# Patient Record
Sex: Male | Born: 1935 | Race: White | Hispanic: No | Smoking: Never smoker
Health system: Southern US, Community
[De-identification: ages and names within clinical notes are randomized; demographics above are authoritative.]

## PROBLEM LIST (undated history)

## (undated) DIAGNOSIS — F419 Anxiety disorder, unspecified: Secondary | ICD-10-CM

## (undated) DIAGNOSIS — F32A Depression, unspecified: Secondary | ICD-10-CM

## (undated) DIAGNOSIS — F329 Major depressive disorder, single episode, unspecified: Secondary | ICD-10-CM

## (undated) HISTORY — PX: SKIN BIOPSY: SHX1

---

## 2015-06-25 ENCOUNTER — Encounter: Payer: Self-pay | Admitting: Emergency Medicine

## 2015-06-25 ENCOUNTER — Emergency Department: Payer: Medicare Other

## 2015-06-25 ENCOUNTER — Inpatient Hospital Stay
Admission: EM | Admit: 2015-06-25 | Discharge: 2015-06-29 | DRG: 871 | Disposition: A | Discharge status: Home / Self Care | Payer: Medicare Other | Attending: Internal Medicine | Admitting: Internal Medicine

## 2015-06-25 DIAGNOSIS — G3183 Dementia with Lewy bodies: Secondary | ICD-10-CM | POA: Diagnosis not present

## 2015-06-25 DIAGNOSIS — J690 Pneumonitis due to inhalation of food and vomit: Secondary | ICD-10-CM | POA: Diagnosis not present

## 2015-06-25 DIAGNOSIS — Z79899 Other long term (current) drug therapy: Secondary | ICD-10-CM | POA: Diagnosis not present

## 2015-06-25 DIAGNOSIS — R451 Restlessness and agitation: Secondary | ICD-10-CM | POA: Diagnosis present

## 2015-06-25 DIAGNOSIS — F0390 Unspecified dementia without behavioral disturbance: Secondary | ICD-10-CM | POA: Diagnosis not present

## 2015-06-25 DIAGNOSIS — R05 Cough: Secondary | ICD-10-CM

## 2015-06-25 DIAGNOSIS — E43 Unspecified severe protein-calorie malnutrition: Secondary | ICD-10-CM | POA: Diagnosis not present

## 2015-06-25 DIAGNOSIS — J9601 Acute respiratory failure with hypoxia: Secondary | ICD-10-CM | POA: Diagnosis not present

## 2015-06-25 DIAGNOSIS — F0391 Unspecified dementia with behavioral disturbance: Secondary | ICD-10-CM | POA: Diagnosis present

## 2015-06-25 DIAGNOSIS — G9341 Metabolic encephalopathy: Secondary | ICD-10-CM | POA: Diagnosis present

## 2015-06-25 DIAGNOSIS — N390 Urinary tract infection, site not specified: Secondary | ICD-10-CM | POA: Diagnosis present

## 2015-06-25 DIAGNOSIS — R0602 Shortness of breath: Secondary | ICD-10-CM

## 2015-06-25 DIAGNOSIS — J189 Pneumonia, unspecified organism: Secondary | ICD-10-CM

## 2015-06-25 DIAGNOSIS — R059 Cough, unspecified: Secondary | ICD-10-CM

## 2015-06-25 DIAGNOSIS — Z681 Body mass index (BMI) 19 or less, adult: Secondary | ICD-10-CM | POA: Diagnosis not present

## 2015-06-25 DIAGNOSIS — R6521 Severe sepsis with septic shock: Secondary | ICD-10-CM | POA: Diagnosis present

## 2015-06-25 DIAGNOSIS — Z66 Do not resuscitate: Secondary | ICD-10-CM | POA: Diagnosis present

## 2015-06-25 DIAGNOSIS — A419 Sepsis, unspecified organism: Secondary | ICD-10-CM | POA: Diagnosis not present

## 2015-06-25 DIAGNOSIS — R64 Cachexia: Secondary | ICD-10-CM | POA: Diagnosis present

## 2015-06-25 HISTORY — DX: Depression, unspecified: F32.A

## 2015-06-25 HISTORY — DX: Major depressive disorder, single episode, unspecified: F32.9

## 2015-06-25 HISTORY — DX: Anxiety disorder, unspecified: F41.9

## 2015-06-25 LAB — COMPREHENSIVE METABOLIC PANEL
ALK PHOS: 78 U/L (ref 38–126)
ALT: 11 U/L — AB (ref 17–63)
AST: 19 U/L (ref 15–41)
Albumin: 3.7 g/dL (ref 3.5–5.0)
Anion gap: 9 (ref 5–15)
BUN: 29 mg/dL — AB (ref 6–20)
CALCIUM: 9 mg/dL (ref 8.9–10.3)
CHLORIDE: 105 mmol/L (ref 101–111)
CO2: 27 mmol/L (ref 22–32)
CREATININE: 1.1 mg/dL (ref 0.61–1.24)
GFR, EST NON AFRICAN AMERICAN: 57 mL/min — AB (ref >60)
Glucose, Bld: 125 mg/dL — ABNORMAL HIGH (ref 65–99)
Potassium: 4.3 mmol/L (ref 3.5–5.1)
SODIUM: 141 mmol/L (ref 135–145)
Total Bilirubin: 0.9 mg/dL (ref 0.3–1.2)
Total Protein: 7.5 g/dL (ref 6.5–8.1)

## 2015-06-25 LAB — URINALYSIS COMPLETE WITH MICROSCOPIC (ARMC ONLY)
Bilirubin Urine: NEGATIVE
GLUCOSE, UA: NEGATIVE mg/dL
HGB URINE DIPSTICK: NEGATIVE
Ketones, ur: NEGATIVE mg/dL
Nitrite: NEGATIVE
PROTEIN: NEGATIVE mg/dL
SPECIFIC GRAVITY, URINE: 1.017 (ref 1.005–1.030)
pH: 7 (ref 5.0–8.0)

## 2015-06-25 LAB — CBC WITH DIFFERENTIAL/PLATELET
BASOS PCT: 0 %
Basophils Absolute: 0.1 10*3/uL (ref 0–0.1)
EOS ABS: 0 10*3/uL (ref 0–0.7)
EOS PCT: 0 %
HCT: 33.3 % — ABNORMAL LOW (ref 40.0–52.0)
Hemoglobin: 10.8 g/dL — ABNORMAL LOW (ref 13.0–18.0)
LYMPHS ABS: 0.3 10*3/uL — AB (ref 1.0–3.6)
Lymphocytes Relative: 2 %
MCH: 30.2 pg (ref 26.0–34.0)
MCHC: 32.5 g/dL (ref 32.0–36.0)
MCV: 92.8 fL (ref 80.0–100.0)
MONOS PCT: 4 %
Monocytes Absolute: 0.7 10*3/uL (ref 0.2–1.0)
NEUTROS PCT: 94 %
Neutro Abs: 14.2 10*3/uL — ABNORMAL HIGH (ref 1.4–6.5)
PLATELETS: 283 10*3/uL (ref 150–440)
RBC: 3.59 MIL/uL — ABNORMAL LOW (ref 4.40–5.90)
RDW: 14.1 % (ref 11.5–14.5)
WBC: 15.3 10*3/uL — ABNORMAL HIGH (ref 3.8–10.6)

## 2015-06-25 LAB — CREATININE, SERUM
CREATININE: 1.06 mg/dL (ref 0.61–1.24)
GFR calc Af Amer: >60 mL/min (ref >60)
GFR calc non Af Amer: 60 mL/min — ABNORMAL LOW (ref >60)

## 2015-06-25 LAB — CBC
HEMATOCRIT: 30.4 % — AB (ref 40.0–52.0)
HEMOGLOBIN: 10.1 g/dL — AB (ref 13.0–18.0)
MCH: 30.8 pg (ref 26.0–34.0)
MCHC: 33.1 g/dL (ref 32.0–36.0)
MCV: 93.2 fL (ref 80.0–100.0)
Platelets: 239 10*3/uL (ref 150–440)
RBC: 3.27 MIL/uL — AB (ref 4.40–5.90)
RDW: 13.7 % (ref 11.5–14.5)
WBC: 13.6 10*3/uL — ABNORMAL HIGH (ref 3.8–10.6)

## 2015-06-25 LAB — LACTIC ACID, PLASMA
LACTIC ACID, VENOUS: 1.2 mmol/L (ref 0.5–2.0)
LACTIC ACID, VENOUS: 0.9 mmol/L (ref 0.5–2.0)

## 2015-06-25 MED ORDER — ACETAMINOPHEN 120 MG RE SUPP
950.0000 mg | Freq: Once | RECTAL | Status: AC
  Administered 2015-06-25: 950 mg via RECTAL
  Filled 2015-06-25: qty 1

## 2015-06-25 MED ORDER — ENOXAPARIN SODIUM 40 MG/0.4ML ~~LOC~~ SOLN
40.0000 mg | SUBCUTANEOUS | Status: DC
  Administered 2015-06-25 – 2015-06-28 (×4): 40 mg via SUBCUTANEOUS
  Filled 2015-06-25 (×4): qty 0.4

## 2015-06-25 MED ORDER — POLYETHYLENE GLYCOL 3350 17 G PO PACK
17.0000 g | PACK | Freq: Every day | ORAL | Status: DC
  Administered 2015-06-27: 17 g via ORAL
  Filled 2015-06-25 (×2): qty 1

## 2015-06-25 MED ORDER — ONDANSETRON HCL 4 MG PO TABS: 4.0000 mg | ORAL_TABLET | Freq: Four times a day (QID) | ORAL | Status: DC | PRN

## 2015-06-25 MED ORDER — MIRTAZAPINE 15 MG PO TABS
15.0000 mg | ORAL_TABLET | Freq: Every day | ORAL | Status: DC
  Administered 2015-06-25 – 2015-06-26 (×2): 15 mg via ORAL
  Filled 2015-06-25 (×2): qty 1

## 2015-06-25 MED ORDER — ALUM & MAG HYDROXIDE-SIMETH 200-200-20 MG/5ML PO SUSP: 30.0000 mL | Freq: Four times a day (QID) | ORAL | Status: DC | PRN

## 2015-06-25 MED ORDER — LORAZEPAM BOLUS VIA INFUSION
0.5000 mg | Freq: Three times a day (TID) | INTRAVENOUS | Status: DC | PRN
Start: 2015-06-25 — End: 2015-06-25

## 2015-06-25 MED ORDER — ACETAMINOPHEN 650 MG RE SUPP
650.0000 mg | Freq: Four times a day (QID) | RECTAL | Status: DC | PRN
Start: 2015-06-25 — End: 2015-06-29
  Administered 2015-06-26: 650 mg via RECTAL
  Filled 2015-06-25: qty 1

## 2015-06-25 MED ORDER — LORAZEPAM 2 MG/ML IJ SOLN
0.5000 mg | Freq: Three times a day (TID) | INTRAMUSCULAR | Status: DC | PRN
Start: 2015-06-25 — End: 2015-06-26

## 2015-06-25 MED ORDER — SODIUM CHLORIDE 0.9 % IV BOLUS (SEPSIS)
1813.0000 mL | Freq: Once | INTRAVENOUS | Status: AC
  Administered 2015-06-25: 1813 mL via INTRAVENOUS

## 2015-06-25 MED ORDER — DEXTROSE 5 % IV SOLN
500.0000 mg | INTRAVENOUS | Status: DC
  Administered 2015-06-25: 500 mg via INTRAVENOUS
  Filled 2015-06-25 (×2): qty 500

## 2015-06-25 MED ORDER — HYDROCODONE-ACETAMINOPHEN 5-325 MG PO TABS: 1.0000 | ORAL_TABLET | ORAL | Status: DC | PRN

## 2015-06-25 MED ORDER — ONDANSETRON HCL 4 MG/2ML IJ SOLN: 4.0000 mg | Freq: Four times a day (QID) | INTRAMUSCULAR | Status: DC | PRN

## 2015-06-25 MED ORDER — SENNOSIDES-DOCUSATE SODIUM 8.6-50 MG PO TABS: 1.0000 | ORAL_TABLET | Freq: Every evening | ORAL | Status: DC | PRN

## 2015-06-25 MED ORDER — ACETAMINOPHEN 325 MG PO TABS
650.0000 mg | ORAL_TABLET | Freq: Four times a day (QID) | ORAL | Status: DC | PRN
Start: 2015-06-25 — End: 2015-06-29

## 2015-06-25 MED ORDER — SODIUM CHLORIDE 0.9 % IJ SOLN
3.0000 mL | Freq: Two times a day (BID) | INTRAMUSCULAR | Status: DC
  Administered 2015-06-26 – 2015-06-28 (×5): 3 mL via INTRAVENOUS

## 2015-06-25 MED ORDER — SODIUM CHLORIDE 0.9 % IV SOLN
INTRAVENOUS | Status: DC
  Administered 2015-06-25 – 2015-06-28 (×4): via INTRAVENOUS

## 2015-06-25 MED ORDER — MELATONIN 3 MG PO TABS: 3.0000 mg | ORAL_TABLET | Freq: Every day | ORAL | Status: DC

## 2015-06-25 MED ORDER — LORAZEPAM 2 MG/ML IJ SOLN
0.5000 mg | Freq: Once | INTRAMUSCULAR | Status: AC
Start: 2015-06-25 — End: 2015-06-25
  Administered 2015-06-25: 0.5 mg via INTRAVENOUS
  Filled 2015-06-25: qty 1

## 2015-06-25 MED ORDER — VANCOMYCIN HCL IN DEXTROSE 1-5 GM/200ML-% IV SOLN
1000.0000 mg | Freq: Once | INTRAVENOUS | Status: AC
  Administered 2015-06-25: 1000 mg via INTRAVENOUS
  Filled 2015-06-25: qty 200

## 2015-06-25 MED ORDER — DEXTROSE 5 % IV SOLN
1.0000 g | INTRAVENOUS | Status: DC
  Administered 2015-06-25: 21:00:00 1 g via INTRAVENOUS
  Filled 2015-06-25 (×2): qty 10

## 2015-06-25 MED ORDER — RISPERIDONE 0.5 MG PO TABS
0.5000 mg | ORAL_TABLET | Freq: Every day | ORAL | Status: DC
  Administered 2015-06-25 – 2015-06-26 (×2): 0.5 mg via ORAL
  Filled 2015-06-25 (×3): qty 1

## 2015-06-25 MED ORDER — PIPERACILLIN-TAZOBACTAM 3.375 G IVPB
3.3750 g | Freq: Once | INTRAVENOUS | Status: AC
  Administered 2015-06-25: 3.375 g via INTRAVENOUS
  Filled 2015-06-25: qty 50

## 2015-06-25 MED ORDER — VITAMIN D (ERGOCALCIFEROL) 1.25 MG (50000 UNIT) PO CAPS: 50000.0000 [IU] | ORAL_CAPSULE | ORAL | Status: DC

## 2015-06-25 NOTE — ED Notes (Signed)
Per EMS:  likes to be called Lu   Family called because he has been acting out of norm, ams, weakness, low grade fever, 99.4 axillary.  family denies foul odors, denies dysuria. Only medial hx is anxiety/depression.     EMS Vital Signs: CBG 128 BP: 140/74 HR: 104 RR: 20 Spo2: 92% RA, 3L 95% ETCO2: 18

## 2015-06-25 NOTE — ED Provider Notes (Signed)
[facility] Emergency Department Provider Note   ____________________________________________  Time seen:  I have reviewed the triage vital signs and the triage nursing note.  HISTORY  Chief Complaint Altered Mental Status and Weakness   Historian Patient is a limited historian with dementia, most history from patient's son  HPI Zakariye Nee is a 81 y.o. male who is currently living with his son, and has a history of dementia, but otherwise good health, who is presenting for evaluation due to the extreme weakness and a new cough for one day. Multiple family members have an upper respiratory infection. They did not know he had a fever until he arrived to triage. No vomiting, abdominal pain, or diarrhea. No skin rashes.    Past Medical History  Diagnosis Date  . Anxiety   . Depression    dementia  Patient Active Problem List   Diagnosis Date Noted  . Sepsis (HCC) 06/25/2015    History reviewed. No pertinent past surgical history.  Current Outpatient Rx  Name  Route  Sig  Dispense  Refill  . Melatonin 3 MG TABS   Oral   Take 3 mg by mouth at bedtime.         . mirtazapine (REMERON) 15 MG tablet   Oral   Take 15 mg by mouth at bedtime.         . polyethylene glycol (MIRALAX / GLYCOLAX) packet   Oral   Take 17 g by mouth daily.         . risperiDONE (RISPERDAL) 1 MG tablet   Oral   Take 0.5 mg by mouth at bedtime.         . Vitamin D, Ergocalciferol, (DRISDOL) 50000 UNITS CAPS capsule   Oral   Take 50,000 Units by mouth once a week. On Tuesday night           Allergies Review of patient's allergies indicates no known allergies.  No family history on file.  Social History Social History  Substance Use Topics  . Smoking status: Never Smoker   . Smokeless tobacco: None  . Alcohol Use: No    Review of Systems  Constitutional: Positive for fever. Eyes: Negative for visual changes. ENT: Negative for sore  throat. Cardiovascular: Negative for chest pain. Respiratory: Negative for shortness of breath. Positive for cough, nonproductive. Gastrointestinal: Negative for abdominal pain, vomiting and diarrhea. Genitourinary: Negative for dysuria. Musculoskeletal: Negative for back pain. Skin: Negative for rash. Neurological: Negative for headache. 10 point Review of Systems otherwise negative ____________________________________________   PHYSICAL EXAM:  VITAL SIGNS: ED Triage Vitals  Enc Vitals Group     BP 06/25/15 1350 149/67 mmHg     Pulse Rate 06/25/15 1350 108     Resp 06/25/15 1350 19     Temp 06/25/15 1414 103.3 F (39.6 C)     Temp Source 06/25/15 1414 Rectal     SpO2 06/25/15 1350 94 %     Weight 06/25/15 1416 133 lb (60.328 kg)     Height 06/25/15 1416  (1.905 m)     Head Cir --      Peak Flow --      Pain Score --      Pain Loc --      Pain Edu? --      Excl. in GC? --      Constitutional: Alert and cooperative, confused. In no distress. Eyes: Conjunctivae are normal. PERRL. Normal extraocular movements. ENT   Head: Normocephalic and  atraumatic.   Nose: No congestion/rhinnorhea.   Mouth/Throat: Mucous membranes are mildly dry.   Neck: No stridor. No pharyngeal erythema. Cardiovascular/Chest: Tachycardic, regular.  No murmurs, rubs, or gallops. Respiratory: Mild rhonchi posteriorly bilaterally. No wheezing. No retractions Gastrointestinal: Soft. No distention, no guarding, no rebound. Nontender   Genitourinary/rectal: No sacral wound Musculoskeletal: Nontender with normal range of motion in all extremities. No joint effusions.  No lower extremity tenderness.  No edema. Neurologic:  No gross or focal neurologic deficits are appreciated. Skin:  Skin is warm, dry and intact. No rash noted. Multiple scabs and old ecchymosis across the legs and elbows.   ____________________________________________   EKG I, Governor Rooksebecca Arshan Jabs, MD, the attending physician  have personally viewed and interpreted all ECGs.   109 bpm. Irregularly irregular. Nonspecific intraventricular conduction delay. Normal axis. Nonspecific ST and T-wave ____________________________________________  LABS (pertinent positives/negatives)  Comprehensive metabolic panel significant for BUN 29 and otherwise without significant abnormality White blood cell count 15.3, hemoglobin 10.8 and platelet count 283 Urinalysis trace leukocytes, 6-30 white blood cells and few bacteria Lactate 1.2 ____________________________________________  RADIOLOGY All Xrays were viewed by me. Imaging interpreted by Radiologist.  Chest x-ray portable:  No active disease __________________________________________  PROCEDURES  Procedure(s) performed: None  Critical Care performed: None  ____________________________________________   ED COURSE / ASSESSMENT AND PLAN  CONSULTATIONS: Hospitalist for admission  Pertinent labs & imaging results that were available during my care of the patient were reviewed by me and considered in my medical decision making (see chart for details).  Mr. Lafayette Simon arrived febrile to [address] with a cough, raising concern for possible sepsis due to a respiratory source. He has tachycardic and was given Tylenol for fever. He has had no hypotension. Code sepsis was initiated due to fever and cough with tachycardia.  Vancomycin and Zosyn were initiated for broad-spectrum coverage for possible sepsis.    Patient / Family / Caregiver informed of clinical course, medical decision-making process, and agree with plan.    ___________________________________________   FINAL CLINICAL IMPRESSION(S) / ED DIAGNOSES   Final diagnoses:  Urinary tract infection without hematuria, site unspecified  Cough  Sepsis due to urinary tract infection (HCC)       Governor Rooksebecca Torri Langston, MD 06/25/15 [phone]

## 2015-06-25 NOTE — H&P (Signed)
[facility] [place] at [facility]   PATIENT NAME: John LeachLowell Simon    MR#:  [id number]  DATE OF BIRTH:  01/10/1923  DATE OF ADMISSION:  06/25/2015  PRIMARY CARE PHYSICIAN: No primary care provider on file.   REQUESTING/REFERRING PHYSICIAN: Dr Shaune PollackLord  CHIEF COMPLAINT:  Fever and cough HISTORY OF PRESENT ILLNESS:  John Simon  is a 81 y.o. male with a known history of dementia who presents with his son for above complaint. Patient son reports approximate 2 weeks ago he took his father home from an assisted living facility in which the patient was at due to agitation and wandering. Over the 2 weeks patient actually has been doing quite well. [facility] and feeling well. This morning the son noticed that his father was very weak and also had a cough. He was concerned and therefore took his temperature which was read at 100. Due to weakness patient son called EMS and EMS brought the patient to the ER. In the emergency room his temp max was 103. He was treated initially with vancomycin and Zosyn for sepsis. His urinalysis does show UTI chest x-ray does not show evidence of pneumonia. Patient is agitated. Patient son also reports that over the past 2 weeks they have been trying to taper off Risperdal which was started a few weeks ago while the patient was at the assisted living facility. They feel that the Risperdal is causing the patient to be sedated and more agitated.  PAST MEDICAL HISTORY:   Past Medical History  Diagnosis Date  . Anxiety   . Depression     PAST SURGICAL HISTORY:  None  SOCIAL HISTORY:   Social History  Substance Use Topics  . Smoking status: Never Smoker   . Smokeless tobacco: Not on file  . Alcohol Use: No    FAMILY HISTORY:  No history of CAD, hypertension or diabetes  DRUG ALLERGIES:  No Known Allergies   REVIEW OF SYSTEMS:  Patient with dementia review of systems not obtainable from  patient  MEDICATIONS AT HOME:   Prior to Admission medications   Medication Sig Start Date End Date Taking? Authorizing Provider  Melatonin 3 MG TABS Take 3 mg by mouth at bedtime.   Yes Historical Provider, MD  mirtazapine (REMERON) 15 MG tablet Take 15 mg by mouth at bedtime.   Yes Historical Provider, MD  polyethylene glycol (MIRALAX / GLYCOLAX) packet Take 17 g by mouth daily.   Yes Historical Provider, MD  risperiDONE (RISPERDAL) 1 MG tablet Take 0.5 mg by mouth at bedtime.   Yes Historical Provider, MD  Vitamin D, Ergocalciferol, (DRISDOL) 50000 UNITS CAPS capsule Take 50,000 Units by mouth once a week. On Tuesday night   Yes Historical Provider, MD      VITAL SIGNS:  Blood pressure 140/65, pulse 101, temperature 101.5 F (38.6 C), temperature source Core (Comment), resp. rate 13, height 6\' 3"  (1.905 m), weight 60.328 kg (133 lb), SpO2 94 %.  PHYSICAL EXAMINATION:  GENERAL:  81 y.o.-year-old patient lying in the bed slightly agitated trying to get off the bed  EYES: Pupils equal, round, reactive to light and accommodation. No scleral icterus. Extraocular muscles intact.  HEENT: Head atraumatic, normocephalic. Oropharynx and nasopharynx clear. Poor dentition NECK:  Supple, no jugular venous distention. No thyroid enlargement, no tenderness.  LUNGS: Normal breath sounds bilaterally, no wheezing, rales,rhonchi or crepitation. No use of accessory muscles of respiration.  CARDIOVASCULAR: S1, S2 normal.  No murmurs, rubs, or gallops.  ABDOMEN: Soft, nontender, nondistended. Bowel sounds present. No organomegaly or mass.  EXTREMITIES: No pedal edema, cyanosis, or clubbing.  NEUROLOGIC: Cranial nerves II through XII are grossly intact. No focal deficits. PSYCHIATRIC: The patient is alert and oriented x 3.  SKIN: No obvious rash, lesion, or ulcer. He has a well-healed abrasion on his elbow  LABORATORY PANEL:   CBC  Recent Labs Lab 06/25/15 1406  WBC 15.3*  HGB 10.8*  HCT 33.3*   PLT 283   ------------------------------------------------------------------------------------------------------------------  Chemistries   Recent Labs Lab 06/25/15 1406  NA 141  K 4.3  CL 105  CO2 27  GLUCOSE 125*  BUN 29*  CREATININE 1.10  CALCIUM 9.0  AST 19  ALT 11*  ALKPHOS 78  BILITOT 0.9   ------------------------------------------------------------------------------------------------------------------  Cardiac Enzymes No results for input(s): TROPONINI in the last 168 hours. ------------------------------------------------------------------------------------------------------------------  RADIOLOGY:  Dg Chest Portable 1 View  06/25/2015  CLINICAL DATA:  Hypoxia and low-grade fever EXAM: PORTABLE CHEST - 1 VIEW COMPARISON:  None. FINDINGS: Cardiac shadow is within normal limits. The lungs are well aerated bilaterally. No focal infiltrate or sizable effusion is seen. No bony abnormality is noted. IMPRESSION: No active disease. Electronically Signed   By: Alcide Clever M.D.   On: 06/25/2015 15:46    EKG:   Too much movement need to reorder  IMPRESSION AND PLAN:    81 year old male with a history of dementia who presents from home with a cough and fever and clinical sepsis.  1. Sepsis: Patient meets sepsis criteria by fever and leukocytosis. Urine analysis is showing a urinary tract infection. Chest x-ray does not show evidence of pneumonia. Given the fact the patient has had a cough I would be concerned that the patient may have an underlying pneumonia. I will start Rocephin and azithromycin. Blood and urine cultures have been ordered in the emergency room. I will order repeat chest x-ray tomorrow to evaluate for pneumonia. If again this is not showing pneumonia there azithromycin can be discontinued.  2. Urinary tract infection without hematuria: Continue Rocephin and please follow up on urine culture.  3. Dementia with agitation: Patient will require a sitter.  Family is trying to titrate Risperdal. He is currently Risperdal 0.5 mg at bedtime. I will continue this dose. I did speak with the family to let them know patient may require some sort of education to help with agitation if he becomes a threat to himself or others.      All the records are reviewed and case discussed with ED provider. Management plans discussed with the patient's sonand he is in agreement.  CODE STATUS: DNR  TOTAL TIME TAKING CARE OF THIS PATIENT: 55 minutes.    Ashantee Deupree M.D on 06/25/2015 at 4:50 PM  Between 7am to 6pm - Pager - [phone] After 6pm go to [email or web address] - password [email or web address] [facility]  [facility] [place] [facility]  Office  [phone]  CC: Primary care physician; No primary care provider on file.

## 2015-06-26 ENCOUNTER — Inpatient Hospital Stay: Payer: Medicare Other

## 2015-06-26 DIAGNOSIS — J690 Pneumonitis due to inhalation of food and vomit: Secondary | ICD-10-CM

## 2015-06-26 DIAGNOSIS — F0390 Unspecified dementia without behavioral disturbance: Secondary | ICD-10-CM

## 2015-06-26 DIAGNOSIS — A419 Sepsis, unspecified organism: Principal | ICD-10-CM

## 2015-06-26 LAB — CBC
HCT: 27.7 % — ABNORMAL LOW (ref 40.0–52.0)
Hemoglobin: 9.4 g/dL — ABNORMAL LOW (ref 13.0–18.0)
MCH: 31.3 pg (ref 26.0–34.0)
MCHC: 33.8 g/dL (ref 32.0–36.0)
MCV: 92.6 fL (ref 80.0–100.0)
PLATELETS: 195 10*3/uL (ref 150–440)
RBC: 2.99 MIL/uL — ABNORMAL LOW (ref 4.40–5.90)
RDW: 13.2 % (ref 11.5–14.5)
WBC: 11.2 10*3/uL — AB (ref 3.8–10.6)

## 2015-06-26 LAB — CBC WITH DIFFERENTIAL/PLATELET
BASOS ABS: 0 10*3/uL (ref 0–0.1)
BASOS PCT: 0 %
EOS ABS: 0 10*3/uL (ref 0–0.7)
Eosinophils Relative: 0 %
HCT: 29.2 % — ABNORMAL LOW (ref 40.0–52.0)
Hemoglobin: 9.4 g/dL — ABNORMAL LOW (ref 13.0–18.0)
Lymphocytes Relative: 3 %
Lymphs Abs: 0.4 10*3/uL — ABNORMAL LOW (ref 1.0–3.6)
MCH: 30.1 pg (ref 26.0–34.0)
MCHC: 32.3 g/dL (ref 32.0–36.0)
MCV: 93.3 fL (ref 80.0–100.0)
MONO ABS: 0.8 10*3/uL (ref 0.2–1.0)
Monocytes Relative: 6 %
NEUTROS ABS: 11.6 10*3/uL — AB (ref 1.4–6.5)
NEUTROS PCT: 91 %
PLATELETS: 206 10*3/uL (ref 150–440)
RBC: 3.12 MIL/uL — AB (ref 4.40–5.90)
RDW: 14 % (ref 11.5–14.5)
WBC: 12.8 10*3/uL — ABNORMAL HIGH (ref 3.8–10.6)

## 2015-06-26 LAB — COMPREHENSIVE METABOLIC PANEL
ALBUMIN: 2.9 g/dL — AB (ref 3.5–5.0)
ALT: 13 U/L — AB (ref 17–63)
AST: 34 U/L (ref 15–41)
Alkaline Phosphatase: 50 U/L (ref 38–126)
Anion gap: 7 (ref 5–15)
BUN: 22 mg/dL — AB (ref 6–20)
CHLORIDE: 109 mmol/L (ref 101–111)
CO2: 24 mmol/L (ref 22–32)
CREATININE: 1.01 mg/dL (ref 0.61–1.24)
Calcium: 7.8 mg/dL — ABNORMAL LOW (ref 8.9–10.3)
GFR calc Af Amer: >60 mL/min (ref >60)
GLUCOSE: 101 mg/dL — AB (ref 65–99)
Potassium: 3.7 mmol/L (ref 3.5–5.1)
SODIUM: 140 mmol/L (ref 135–145)
Total Bilirubin: 0.6 mg/dL (ref 0.3–1.2)
Total Protein: 5.9 g/dL — ABNORMAL LOW (ref 6.5–8.1)

## 2015-06-26 LAB — PROTIME-INR
INR: 1.38
PROTHROMBIN TIME: 17.1 s — AB (ref 11.4–15.0)

## 2015-06-26 LAB — GLUCOSE, CAPILLARY: GLUCOSE-CAPILLARY: 96 mg/dL (ref 65–99)

## 2015-06-26 LAB — BLOOD GAS, ARTERIAL
ACID-BASE DEFICIT: 0.3 mmol/L (ref 0.0–2.0)
ALLENS TEST (PASS/FAIL): POSITIVE — AB
Bicarbonate: 24 mEq/L (ref 21.0–28.0)
FIO2: 0.44
O2 Saturation: 89.8 %
PCO2 ART: 37 mmHg (ref 32.0–48.0)
PH ART: 7.42 (ref 7.350–7.450)
Patient temperature: 37
pO2, Arterial: 57 mmHg — ABNORMAL LOW (ref 83.0–108.0)

## 2015-06-26 LAB — LACTIC ACID, PLASMA: LACTIC ACID, VENOUS: 1.1 mmol/L (ref 0.5–2.0)

## 2015-06-26 LAB — BASIC METABOLIC PANEL
Anion gap: 7 (ref 5–15)
BUN: 22 mg/dL — AB (ref 6–20)
CO2: 23 mmol/L (ref 22–32)
CREATININE: 1.02 mg/dL (ref 0.61–1.24)
Calcium: 7.8 mg/dL — ABNORMAL LOW (ref 8.9–10.3)
Chloride: 108 mmol/L (ref 101–111)
GFR calc Af Amer: >60 mL/min (ref >60)
Glucose, Bld: 101 mg/dL — ABNORMAL HIGH (ref 65–99)
Potassium: 3.4 mmol/L — ABNORMAL LOW (ref 3.5–5.1)
SODIUM: 138 mmol/L (ref 135–145)

## 2015-06-26 LAB — APTT: aPTT: 43 seconds — ABNORMAL HIGH (ref 24–36)

## 2015-06-26 LAB — PROCALCITONIN: PROCALCITONIN: 4.64 ng/mL

## 2015-06-26 LAB — MRSA PCR SCREENING: MRSA BY PCR: NEGATIVE

## 2015-06-26 LAB — CORTISOL: CORTISOL PLASMA: 19.3 ug/dL

## 2015-06-26 LAB — TROPONIN I: Troponin I: 0.14 ng/mL — ABNORMAL HIGH (ref <0.031)

## 2015-06-26 MED ORDER — PIPERACILLIN-TAZOBACTAM 3.375 G IVPB
3.3750 g | Freq: Three times a day (TID) | INTRAVENOUS | Status: DC
  Administered 2015-06-26 – 2015-06-27 (×3): 3.375 g via INTRAVENOUS
  Filled 2015-06-26 (×5): qty 50

## 2015-06-26 MED ORDER — VANCOMYCIN HCL IN DEXTROSE 750-5 MG/150ML-% IV SOLN
750.0000 mg | INTRAVENOUS | Status: DC
  Filled 2015-06-26: qty 150

## 2015-06-26 MED ORDER — SODIUM CHLORIDE 0.9 % IV BOLUS (SEPSIS)
1000.0000 mL | INTRAVENOUS | Status: AC
  Administered 2015-06-26 (×2): 1000 mL via INTRAVENOUS

## 2015-06-26 MED ORDER — PIPERACILLIN-TAZOBACTAM 3.375 G IVPB 30 MIN
3.3750 g | Freq: Once | INTRAVENOUS | Status: AC
  Administered 2015-06-26: 3.375 g via INTRAVENOUS
  Filled 2015-06-26: qty 50

## 2015-06-26 MED ORDER — VANCOMYCIN HCL IN DEXTROSE 750-5 MG/150ML-% IV SOLN
750.0000 mg | INTRAVENOUS | Status: DC
  Administered 2015-06-26: 750 mg via INTRAVENOUS
  Filled 2015-06-26 (×2): qty 150

## 2015-06-26 MED ORDER — VANCOMYCIN HCL IN DEXTROSE 1-5 GM/200ML-% IV SOLN
1000.0000 mg | Freq: Once | INTRAVENOUS | Status: AC
  Administered 2015-06-26: 1000 mg via INTRAVENOUS
  Filled 2015-06-26: qty 200

## 2015-06-26 MED ORDER — VANCOMYCIN HCL IN DEXTROSE 1-5 GM/200ML-% IV SOLN
1000.0000 mg | Freq: Once | INTRAVENOUS | Status: DC
Start: 2015-06-26 — End: 2015-06-26
  Filled 2015-06-26: qty 200

## 2015-06-26 NOTE — Progress Notes (Signed)
Patient seen for nasotracheal suction. On nasal cannula with o2 saturation noted at 6liters. BBS with coarse rhonci throughout. Suctioned left nare x2 for large amount of white/yellow secretions. Pre oxygenated with nonrebreather mask at 15liters. Patient tolerated procedure well. BBS clear post suction. o2 saturation on 6liters noted in mid 90's HR remained 102-106 during procedure.

## 2015-06-26 NOTE — Progress Notes (Signed)
[facility] - [place] at [facility]   PATIENT NAME: John LeachLowell Pines    MR#:  [id number]  DATE OF BIRTH:  10/16/1922  SUBJECTIVE:  CHIEF COMPLAINT:   Chief Complaint  Patient presents with  . Altered Mental Status  . Weakness  sleepy. Not wanting to open eyes, febrile all evening, Tmax this am up to 102.1 - worrisome for getting septic - more lethargic, hypotensive (94/40)  REVIEW OF SYSTEMS:  Review of Systems  Unable to perform ROS: critical illness   DRUG ALLERGIES:  No Known Allergies VITALS:  Blood pressure 98/47, pulse 87, temperature 99.1 F (37.3 C), temperature source Oral, resp. rate 20, height 6\' 3"  (1.905 m), weight 60.328 kg (133 lb), SpO2 90 %. PHYSICAL EXAMINATION:  Physical Exam  Constitutional: He appears lethargic, malnourished and dehydrated. He appears unhealthy. He appears cachectic. He appears toxic. He has a sickly appearance.  HENT:  Head: Normocephalic and atraumatic.  Eyes: Conjunctivae and EOM are normal. Pupils are equal, round, and reactive to light.  Neck: Normal range of motion. Neck supple. No tracheal deviation present. No thyromegaly present.  Cardiovascular: Normal rate, regular rhythm and normal heart sounds.   Pulmonary/Chest: Accessory muscle usage present. Tachypnea noted. He is in respiratory distress. He has decreased breath sounds in the right middle field and the right lower field. He has no wheezes. He has rhonchi in the right lower field. He exhibits no tenderness.  Abdominal: Soft. Bowel sounds are normal. He exhibits no distension. There is no tenderness.  Musculoskeletal: Normal range of motion.  Neurological: He appears lethargic. No cranial nerve deficit.  Lethargic, not opening eyes, moving extremities voluntarily  Skin: Skin is warm and dry. No rash noted.  Psychiatric:  Lethargic, not opening eyes, moving extremities voluntarily   LABORATORY PANEL:   CBC  Recent Labs Lab 06/25/15 1919  WBC 13.6*   HGB 10.1*  HCT 30.4*  PLT 239   ------------------------------------------------------------------------------------------------------------------ Chemistries   Recent Labs Lab 06/25/15 1406 06/25/15 1919  NA 141  --   K 4.3  --   CL 105  --   CO2 27  --   GLUCOSE 125*  --   BUN 29*  --   CREATININE 1.10 1.06  CALCIUM 9.0  --   AST 19  --   ALT 11*  --   ALKPHOS 78  --   BILITOT 0.9  --    RADIOLOGY:  Dg Chest Portable 1 View  06/25/2015  CLINICAL DATA:  Hypoxia and low-grade fever EXAM: PORTABLE CHEST - 1 VIEW COMPARISON:  None. FINDINGS: Cardiac shadow is within normal limits. The lungs are well aerated bilaterally. No focal infiltrate or sizable effusion is seen. No bony abnormality is noted. IMPRESSION: No active disease. Electronically Signed   By: Alcide CleverMark  Lukens M.D.   On: 06/25/2015 15:46   ASSESSMENT AND PLAN:  81 year old male with a history of dementia who presents from home with a cough and fever and clinical sepsis.  * Septic shock: likely due to aspiration pneumonia although CXR neg. Can also be due to urine infection although doubt it. He's persistently febrile and now getting more hypotensive, AMS - will start sepsis protocol and transfer him to CCU for close monitoring. STAT PCCM c/s (d/w Dr Dema SeverinMungal) - change abx to Vanco + zosyn and get STAT CT chest. May ned pressors.  * Acute resp failure: requiring 5-6 liters O2 all night and then nonrebreather 15 liters early this am to maintain sats above 88%,  ct chest and PCCM c/s  * Acute Metabolic encephalopathy: will get ABG STAT, could be due to ativan that he received y'day as he was very agitated - will stop that and monitor him in CCU  * Possible Urinary tract infection without hematuria: change abx to zosyn. follow up on urine culture.  * Dementia with agitation: Patient MAY require a sitter although son prefers not to (he is coming to the Hospital). Family is trying to titrate Risperdal. He is currently  Risperdal 0.5 mg at bedtime - continue this dose.      All the records are reviewed and case discussed with Care Management/Social Worker. Management plans discussed with the patient, family (SON) and they are in agreement.  CODE STATUS: DNR  TOTAL TIME (CRITICAL CARE) TAKING CARE OF THIS PATIENT: 45 minutes.   High risk for cardio-pulmo arrest and multi-organ failure and possible death.  More than 50% of the time was spent in counseling/coordination of care: YES (d/w son over phone @ [phone] and updated him about critical illness and poor prognosis, also updated him about possible sepsis and transfer to CCU for close monitoring, he's ok and wants to avoid sitter, he's on his way here)  POSSIBLE D/C IN 2-3 DAYS, DEPENDING ON CLINICAL CONDITION.   [facility], Marvina Danner M.D on 06/26/2015 at 7:47 AM  Between 7am to 6pm - Pager - [phone]  After 6pm go to [email or web address] - password [email or web address] [facility]  [place]  [facility]  Office  [phone]  CC: Primary care physician; No primary care provider on file.

## 2015-06-26 NOTE — Progress Notes (Signed)
Notified MD of patients desaturations and febrile state over night. Low BP. RT and MD present with patient this am. Son aware and on his way to hospital. MD order to transfer patient to stepdown.

## 2015-06-26 NOTE — Progress Notes (Signed)
BP elevated, temp down, MD aware. MD wants to continue with transfer to stepdown. Report given to [place], [place]

## 2015-06-26 NOTE — Progress Notes (Signed)
Received patient into room 5. He is restless and pulling his oxygen off pulse ox is 87% on 6 liter mnasal cannula and NRB applied and O2 sat increased to 100%.

## 2015-06-26 NOTE — Progress Notes (Signed)
Dr. Sherryll BurgerShah notified of troponin level.

## 2015-06-26 NOTE — Plan of Care (Signed)
Problem: Urinary Elimination: Goal: Signs and symptoms of infection will decrease Outcome: Progressing Pt continues to receiving IVF and antibiotics. PO intake encouraged.  Problem: Education: Goal: Knowledge of [place] [facility] Education information/materials will improve Outcome: Progressing Pt has a non-productive cough, occasionally sounds gurgly in his throat. Continue to assess. Remains afebrile.  Problem: Safety: Goal: Ability to remain free from injury will improve Outcome: Progressing Pt is alert to self, high fall risk. Safety sitter at bedside. Encouraged to ask for assistance when needed

## 2015-06-26 NOTE — Consult Note (Addendum)
PULMONARY / CRITICAL CARE MEDICINE   Name: John LeachLowell Simon MRN: [id number] DOB: 04/29/1923    ADMISSION DATE:  06/25/2015 CONSULTATION DATE:  06/26/15  REFERRING MD :  Dr. Delfino LovettVipul Shah   CHIEF COMPLAINT:    fever and cough, possible sepsis   HISTORY OF PRESENT ILLNESS - hx per chart review and family at bedside    81 y.o. male with a known history of dementia who presents with his son for above complaint. Patient son reports approximate 2 weeks ago he took his father home from an assisted living facility in which the patient was at due to agitation and wandering. Over the 2 weeks patient actually has been doing quite well. [facility] was at [facility] ago walking and feeling well. On 11/23 the son noticed that his father was very weak and also had a cough. He was concerned and therefore took his temperature which was read at 100. Due to weakness patient son called EMS and EMS brought the patient to the ER. In the emergency room his temp max was 103. He was treated initially with vancomycin and Zosyn for sepsis. His urinalysis does show UTI chest x-ray does not show evidence of pneumonia. Patient is agitated. Patient son also reports that over the past 2 weeks they have been trying to taper off Risperdal which was started a few weeks ago while the patient was at the assisted living facility. They feel that the Risperdal is causing the patient to be sedated and more agitated. On 11/24 patient was noted to be more somnolent, with lower BP, and concerns for becoming septic, he was transferred to the ICU for closer monitoring. His respiratory status worsened overnight, initially on 5-6L [place] then placed on NRB to maintain sats>88%. CT chest showed - bilateral basilar inflitrates, R>L - possible aspiration.   SIGNIFICANT EVENTS   11/24>>possible asp PNA, transferred to ICU  PAST MEDICAL HISTORY    :  Past Medical History  Diagnosis Date  . Anxiety   . Depression    History reviewed. No pertinent  past surgical history. Prior to Admission medications   Medication Sig Start Date End Date Taking? Authorizing Provider  Melatonin 3 MG TABS Take 3 mg by mouth at bedtime.   Yes Historical Provider, MD  mirtazapine (REMERON) 15 MG tablet Take 15 mg by mouth at bedtime.   Yes Historical Provider, MD  polyethylene glycol (MIRALAX / GLYCOLAX) packet Take 17 g by mouth daily.   Yes Historical Provider, MD  risperiDONE (RISPERDAL) 1 MG tablet Take 0.5 mg by mouth at bedtime.   Yes Historical Provider, MD  Vitamin D, Ergocalciferol, (DRISDOL) 50000 UNITS CAPS capsule Take 50,000 Units by mouth once a week. On Tuesday night   Yes Historical Provider, MD   No Known Allergies   FAMILY HISTORY   History reviewed. No pertinent family history.    SOCIAL HISTORY    reports that he has never smoked. He does not have any smokeless tobacco history on file. He reports that he does not drink alcohol. His drug history is not on file.  Review of Systems  Unable to perform ROS: dementia      VITAL SIGNS    Temp:  [98.9 F (37.2 C)-103.3 F (39.6 C)] 99.1 F (37.3 C) (11/24 0744) Pulse Rate:  [83-109] 84 (11/24 0749) Resp:  [13-29] 20 (11/24 0501) BP: (94-157)/(40-93) 94/40 mmHg (11/24 0749) SpO2:  [86 %-100 %] 88 % (11/24 0749) Weight:  [133 lb (60.328 kg)] 133 lb (  60.328 kg) (11/23 1416) HEMODYNAMICS:   VENTILATOR SETTINGS:   INTAKE / OUTPUT:  Intake/Output Summary (Last 24 hours) at 06/26/15 0800 Last data filed at 06/26/15 9528  Gross per 24 hour  Intake   2250 ml  Output    520 ml  Net   1730 ml       PHYSICAL EXAM   Physical Exam  Constitutional: He appears well-developed.  HENT:  Head: Normocephalic and atraumatic.  Right Ear: External ear normal.  Left Ear: External ear normal.  Eyes: Conjunctivae are normal. Pupils are equal, round, and reactive to light.  Neck: Neck supple. No thyromegaly present.  Cardiovascular: Normal rate, regular rhythm and normal heart  sounds.   Pulmonary/Chest:  On NRB Coarse upper airway sounds No wheezes  Abdominal: Soft.  Neurological:  obtunded  Skin: Skin is warm and dry.       LABS   LABS:  CBC  Recent Labs Lab 06/25/15 1406 06/25/15 1919 06/26/15 0658  WBC 15.3* 13.6* 11.2*  HGB 10.8* 10.1* 9.4*  HCT 33.3* 30.4* 27.7*  PLT 283 239 195   Coag's No results for input(s): APTT, INR in the last 168 hours. BMET  Recent Labs Lab 06/25/15 1406 06/25/15 1919  NA 141  --   K 4.3  --   CL 105  --   CO2 27  --   BUN 29*  --   CREATININE 1.10 1.06  GLUCOSE 125*  --    Electrolytes  Recent Labs Lab 06/25/15 1406  CALCIUM 9.0   Sepsis Markers  Recent Labs Lab 06/25/15 1405 06/25/15 1926  LATICACIDVEN 1.2 0.9   ABG No results for input(s): PHART, PCO2ART, PO2ART in the last 168 hours. Liver Enzymes  Recent Labs Lab 06/25/15 1406  AST 19  ALT 11*  ALKPHOS 78  BILITOT 0.9  ALBUMIN 3.7   Cardiac Enzymes No results for input(s): TROPONINI, PROBNP in the last 168 hours. Glucose No results for input(s): GLUCAP in the last 168 hours.   Recent Results (from the past 240 hour(s))  Urine culture     Status: None (Preliminary result)   Collection Time: 06/25/15  2:05 PM  Result Value Ref Range Status   Specimen Description URINE, RANDOM  Final   Special Requests NONE  Final   Culture NO GROWTH < 24 HOURS  Final   Report Status PENDING  Incomplete     Current facility-administered medications:  .  0.9 %  sodium chloride infusion, , Intravenous, Continuous, Adrian Saran, MD, Last Rate: 75 mL/hr at 06/26/15 0744 .  acetaminophen (TYLENOL) tablet 650 mg, 650 mg, Oral, Q6H PRN **OR** acetaminophen (TYLENOL) suppository 650 mg, 650 mg, Rectal, Q6H PRN, Adrian Saran, MD, 650 mg at 06/26/15 0503 .  alum & mag hydroxide-simeth (MAALOX/MYLANTA) 200-200-20 MG/5ML suspension 30 mL, 30 mL, Oral, Q6H PRN, Adrian Saran, MD .  azithromycin (ZITHROMAX) 500 mg in dextrose 5 % 250 mL IVPB, 500 mg,  Intravenous, Q24H, Sital Mody, MD, 500 mg at 06/25/15 1948 .  cefTRIAXone (ROCEPHIN) 1 g in dextrose 5 % 50 mL IVPB, 1 g, Intravenous, Q24H, Adrian Saran, MD, 1 g at 06/25/15 2058 .  enoxaparin (LOVENOX) injection 40 mg, 40 mg, Subcutaneous, Q24H, Sital Mody, MD, 40 mg at 06/25/15 2101 .  HYDROcodone-acetaminophen (NORCO/VICODIN) 5-325 MG per tablet 1-2 tablet, 1-2 tablet, Oral, Q4H PRN, Adrian Saran, MD .  mirtazapine (REMERON) tablet 15 mg, 15 mg, Oral, QHS, Adrian Saran, MD, 15 mg at 06/25/15 2058 .  ondansetron (ZOFRAN)  tablet 4 mg, 4 mg, Oral, Q6H PRN **OR** ondansetron (ZOFRAN) injection 4 mg, 4 mg, Intravenous, Q6H PRN, Sital Mody, MD .  piperacillin-tazobactam (ZOSYN) IVPB 3.375 g, 3.375 g, Intravenous, Once, Vipul Shah, MD .  polyethylene glycol (MIRALAX / GLYCOLAX) packet 17 g, 17 g, Oral, Daily, Sital Mody, MD .  risperiDONE (RISPERDAL) tablet 0.5 mg, 0.5 mg, Oral, QHS, Sital Mody, MD, 0.5 mg at 06/25/15 2058 .  senna-docusate (Senokot-S) tablet 1 tablet, 1 tablet, Oral, QHS PRN, Sital Mody, MD .  sodium chloride 0.9 % bolus 1,000 mL, 1,000 mL, Intravenous, Q1H, Vipul Shah, MD .  sodium chloride 0.9 % injection 3 mL, 3 mL, Intravenous, Q12H, Adrian Saran, MD, 3 mL at 06/26/15 0745 .  vancomycin (VANCOCIN) IVPB 1000 mg/200 mL premix, 1,000 mg, Intravenous, Once, Delfino Lovett, MD .  Melene Muller ON 07/01/2015] Vitamin D (Ergocalciferol) (DRISDOL) capsule 50,000 Units, 50,000 Units, Oral, Weekly, Adrian Saran, MD  IMAGING    Dg Chest Portable 1 View  06/25/2015  CLINICAL DATA:  Hypoxia and low-grade fever EXAM: PORTABLE CHEST - 1 VIEW COMPARISON:  None. FINDINGS: Cardiac shadow is within normal limits. The lungs are well aerated bilaterally. No focal infiltrate or sizable effusion is seen. No bony abnormality is noted. IMPRESSION: No active disease. Electronically Signed   By: Alcide Clever M.D.   On: 06/25/2015 15:46      Indwelling Urinary Catheter continued, requirement due to   Reason to continue  Indwelling Urinary Catheter for strict Intake/Output monitoring for hemodynamic instability   Central Line continued, requirement due to   Reason to continue [employer] Monitoring of central venous pressure or other hemodynamic parameters   Ventilator continued, requirement due to, resp failure    Ventilator Sedation RASS 0 to -2   Cultures: BCx2 11/24>> UC 11/24>> Sputum  Antibiotics: Zosyn 11/24>> Vanc 11/24>>  Lines:   ASSESSMENT/PLAN  89 with mild dementia, now with possible aspiration PNA  PULMONARY A: Acute Resp Failure Aspiration Pneumonia Bilateral basilar PNA Sepsis P:   - cont with antibiotics - f\u blood cultures - NRB, maintain sats >88% - CT Chest - R>L basilar opacities - F\U cultures - cont with medical treatment, family stated that patient is a DNR  CARDIOVASCULAR -hemodynamic monitoring -may require pressors, can given small dose levophed if needed  RENAL A:   ?UTI P:   - cont with vanc\zosyn - monitor I\O - f\u UOP - f\u urine cx  GASTROINTESTINAL -PPI  HEMATOLOGIC - CBC    INFECTIOUS A:  Sepsis Aspiration PNA\Basilar PNA P:   - cont with abx - f\u  cultures  NEUROLOGIC A:  Mild dementia P:   RASS goal: 0 - limit benzos and antipsychotics   SOCIAL  - Family (son and daughter-in-law) updated on patient's current clinical status, they are not opposed to short-term intubation for 1-2 days, however after prolonged discussion and given patient's underlying comorbidities, they have decided on a full DO NOT RESUSCITATE, they wish to proceed with supportive medical care in the form of antibiotics, gentle IV fluids, pressors if needed.   I have personally obtained a history, examined the patient, evaluated laboratory and imaging results, formulated the assessment and plan and placed orders.  The Patient requires high complexity decision making for assessment and support, frequent evaluation and titration of therapies,  application of advanced monitoring technologies and extensive interpretation of multiple databases.   Critical Care Time devoted to patient care services described in this note is 45 minutes.   Overall, patient  is critically ill, prognosis is guarded. Patient at high risk for cardiac arrest and death.   Stephanie Acre, MD [facility] Pager [phone] (please enter 7-digits) On Call Pager - [phone] (please enter 7-digits)     06/26/2015, 8:00 AM  Note: This note was prepared with Dragon dictation along with smaller phrase technology. Any transcriptional errors that result from this process are unintentional.

## 2015-06-26 NOTE — Progress Notes (Addendum)
ANTIBIOTIC CONSULT NOTE - INITIAL  Pharmacy Consult for vancomycin and piperacillin/tazobactam  Indication: rule out sepsis  No Known Allergies  Patient Measurements: Height:  (190.5 cm) Weight: 133 lb (60.328 kg) IBW/kg (Calculated) : 84.5  Vital Signs: Temp: 99.2 F (37.3 C) (11/24 0836) Temp Source: Axillary (11/24 0836) BP: 109/46 mmHg (11/24 0836) Pulse Rate: 81 (11/24 0836) Intake/Output from previous day: 11/23 0701 - 11/24 0700 In: 2250 [I.V.:2250] Out: 520 [Urine:520] Intake/Output from this shift:    Labs:  Recent Labs  06/25/15 1406 06/25/15 1919 06/26/15 0658  WBC 15.3* 13.6* 11.2*  HGB 10.8* 10.1* 9.4*  PLT 283 239 195  CREATININE 1.10 1.06 1.02   Estimated Creatinine Clearance: 41.1 mL/min (by C-G formula based on Cr of 1.02). No results for input(s): VANCOTROUGH, VANCOPEAK, VANCORANDOM, GENTTROUGH, GENTPEAK, GENTRANDOM, TOBRATROUGH, TOBRAPEAK, TOBRARND, AMIKACINPEAK, AMIKACINTROU, AMIKACIN in the last 72 hours.   Microbiology: Recent Results (from the past 720 hour(s))  Urine culture     Status: None (Preliminary result)   Collection Time: 06/25/15  2:05 PM  Result Value Ref Range Status   Specimen Description URINE, RANDOM  Final   Special Requests NONE  Final   Culture NO GROWTH < 24 HOURS  Final   Report Status PENDING  Incomplete    Medical History: Past Medical History  Diagnosis Date  . Anxiety   . Depression     Medications:  Anti-infectives    Start     Dose/Rate Route Frequency Ordered Stop   06/26/15 1800  vancomycin (VANCOCIN) IVPB 750 mg/150 ml premix     750 mg 150 mL/hr over 60 Minutes Intravenous Every 18 hours 06/26/15 0836     06/26/15 1400  piperacillin-tazobactam (ZOSYN) IVPB 3.375 g     3.375 g 12.5 mL/hr over 240 Minutes Intravenous 3 times per day 06/26/15 0834     06/26/15 0900  piperacillin-tazobactam (ZOSYN) IVPB 3.375 g     3.375 g 100 mL/hr over 30 Minutes Intravenous  Once 06/26/15 0756     06/26/15  0900  vancomycin (VANCOCIN) IVPB 1000 mg/200 mL premix     1,000 mg 200 mL/hr over 60 Minutes Intravenous  Once 06/26/15 0756     06/25/15 2000  cefTRIAXone (ROCEPHIN) 1 g in dextrose 5 % 50 mL IVPB  Status:  Discontinued     1 g 100 mL/hr over 30 Minutes Intravenous Every 24 hours 06/25/15 1834 06/26/15 0827   06/25/15 1900  azithromycin (ZITHROMAX) 500 mg in dextrose 5 % 250 mL IVPB  Status:  Discontinued     500 mg 250 mL/hr over 60 Minutes Intravenous Every 24 hours 06/25/15 1834 06/26/15 0827   06/25/15 1445  vancomycin (VANCOCIN) IVPB 1000 mg/200 mL premix     1,000 mg 200 mL/hr over 60 Minutes Intravenous  Once 06/25/15 1436 06/25/15 1550   06/25/15 1445  piperacillin-tazobactam (ZOSYN) IVPB 3.375 g     3.375 g 12.5 mL/hr over 240 Minutes Intravenous  Once 06/25/15 1436 06/25/15 1848     Assessment: Pharmacy consulted to dose vancomycin and piperacillin/tazobactam in this 81 year old male for sepsis. Patient was admitted yesterday and started on azithromycin and ceftriaxone but continued to spike fevers so antibiotics were broadened.  Use actual body weight of 60.3 kg for calculations  Kinetics: Ke: 0.038 Half-life: 18 hours Vd = 42L Cmin: ~17 mcg/mL  Goal of Therapy:  Vancomycin trough level 15-20 mcg/ml  Plan:  Measure antibiotic drug levels at steady state Follow up culture results  Gave vancomycin 1g x1 dose Will follow with vancomycin 750 mg IV q18h (stacked dose to begin 9 hours after initial dose at 2130 tonight) Vancomycin trough ordered for 11/26 at 0900 prior to 4th dose  Piperacillin/tazobactam 3.375 g IV q8h EI per renal function and indication.   Pharmacy will continue to monitor, thank you for the consult.  Jodelle RedMary M Swayne 06/26/2015,8:40 AM

## 2015-06-27 DIAGNOSIS — F0280 Dementia in other diseases classified elsewhere without behavioral disturbance: Secondary | ICD-10-CM

## 2015-06-27 DIAGNOSIS — G3183 Dementia with Lewy bodies: Secondary | ICD-10-CM

## 2015-06-27 DIAGNOSIS — E43 Unspecified severe protein-calorie malnutrition: Secondary | ICD-10-CM | POA: Insufficient documentation

## 2015-06-27 DIAGNOSIS — J9601 Acute respiratory failure with hypoxia: Secondary | ICD-10-CM

## 2015-06-27 LAB — CBC
HEMATOCRIT: 32.1 % — AB (ref 40.0–52.0)
HEMOGLOBIN: 10.5 g/dL — AB (ref 13.0–18.0)
MCH: 30.4 pg (ref 26.0–34.0)
MCHC: 32.7 g/dL (ref 32.0–36.0)
MCV: 93 fL (ref 80.0–100.0)
Platelets: 217 10*3/uL (ref 150–440)
RBC: 3.45 MIL/uL — AB (ref 4.40–5.90)
RDW: 14.1 % (ref 11.5–14.5)
WBC: 11.8 10*3/uL — ABNORMAL HIGH (ref 3.8–10.6)

## 2015-06-27 LAB — BASIC METABOLIC PANEL
Anion gap: 6 (ref 5–15)
BUN: 20 mg/dL (ref 6–20)
CHLORIDE: 109 mmol/L (ref 101–111)
CO2: 24 mmol/L (ref 22–32)
Calcium: 8.2 mg/dL — ABNORMAL LOW (ref 8.9–10.3)
Creatinine, Ser: 0.9 mg/dL (ref 0.61–1.24)
GFR calc non Af Amer: >60 mL/min (ref >60)
Glucose, Bld: 97 mg/dL (ref 65–99)
POTASSIUM: 3.9 mmol/L (ref 3.5–5.1)
SODIUM: 139 mmol/L (ref 135–145)

## 2015-06-27 LAB — URINE CULTURE: Culture: NO GROWTH

## 2015-06-27 MED ORDER — SODIUM CHLORIDE 0.9 % IV SOLN
3.0000 g | Freq: Four times a day (QID) | INTRAVENOUS | Status: DC
  Administered 2015-06-27 – 2015-06-29 (×6): 3 g via INTRAVENOUS
  Filled 2015-06-27 (×11): qty 3

## 2015-06-27 NOTE — Progress Notes (Signed)
Son called to inform staff that his father has a history of reaction to both Risperdal and Remeron. Both medications have caused hallucinations and delusions per son's records. Left contact info for questions. Cato MulliganSteve Geiger [phone].

## 2015-06-27 NOTE — Progress Notes (Signed)
Pharmacy Antibiotic Time-Out Note  John Simon is a 81 y.o. year-old male admitted on 06/25/2015.  The patient is currently on Vancomycin and Zosyn for Aspiration PNA.  Assessment/Plan: MRSA PCR negative and treating patient for aspiration PNA. Recommended D/C of Vancomycin. MD approved and added 5 days stop date to Zosyn.   Temp (24hrs), Avg:98.8 F (37.1 C), Min:97.6 F (36.4 C), Max:100.3 F (37.9 C)   Recent Labs Lab 06/25/15 1406 06/25/15 1919 06/26/15 0658 06/26/15 1027 06/27/15 0605  WBC 15.3* 13.6* 11.2* 12.8* 11.8*    Recent Labs Lab 06/25/15 1406 06/25/15 1919 06/26/15 0658 06/26/15 1027 06/27/15 0605  CREATININE 1.10 1.06 1.02 1.01 0.90   Estimated Creatinine Clearance: 46.5 mL/min (by C-G formula based on Cr of 0.9).    Antimicrobials this admission: 11/23-11/24>> Azithromycin and ceftriaxone  11/24-11/25 >> Vancomycin and Zosyn  Microbiology Results: 11/23 BCx: NG x 2 days 11/26 UCx: NG x 2 days 11/24 MRSA PCR: negative   Thank you for allowing pharmacy to be a part of this patient's care.  Gardner CandleSheema M Birdell Frasier PharmD 06/27/2015 12:08 PM

## 2015-06-27 NOTE — Care Management Note (Signed)
Case Management Note  Patient Details  Name: John Simon MRN: [id number] Date of Birth: August 26, 1922  Subjective/Objective:   RNCM consult for home health needs. Met with son, Natnael Biederman to discuss discharge planning. Patient lives at home with son. He has been in 3 different facilities and was kicked out due to behavior problems.  Son expressed that he would like to take patient back to his home again. Son's wife is a Marine scientist at [employer]. PCP is Dr. Tarri Abernethy in [place], [place] [phone]). They are in the process of changing patients physician. He has an appointment with Dr. Doy Hutching on Dec. 29. At base line patient has dementia, he is ambulatory but confused.  Son is grateful for home health services.                Action/Plan: Offered list of agencies. They choose [facility]. Referral called to [facility] for SN, PT, OT, SW and HHA.  Will assess needs as patient progresses.                Expected Discharge Date:                  Expected Discharge Plan:  [place]  In-House Referral:     Discharge planning Services  CM Consult  Post Acute Care Choice:  Home Health Choice offered to:  Adult Children  DME Arranged:    DME Agency:  [facility] Arranged:  RN, PT, Nurse's Aide, Social Work [employer] Agency:     Status of Service:  In process, will continue to follow  Medicare Important Message Given:    Date Medicare IM Given:    Medicare IM give by:    Date Additional Medicare IM Given:    Additional Medicare Important Message give by:     If discussed at [place] of Stay Meetings, dates discussed:    Additional Comments:  Jolly Mango, RN 06/27/2015, 10:05 AM

## 2015-06-27 NOTE — Progress Notes (Signed)
Initial Nutrition Assessment  DOCUMENTATION CODES:   Severe malnutrition in context of chronic illness  INTERVENTION:   Medical Food Supplement Therapy: recommend addition of Mighty Shakes, Magic Cup on meal trays Coordination of Care: if concern for aspiration with eating/drinking, recommend SLP evaluation to evaluate swallowing, appropriate diet consistency. Pt does requires feeding assistance at meal periods, RN aware   NUTRITION DIAGNOSIS:   Malnutrition related to chronic illness, lethargy/confusion as evidenced by severe depletion of body fat, severe depletion of muscle mass.   GOAL:   Patient will meet greater than or equal to 90% of their needs   MONITOR:    (Energy Intake, Anthropometrics, Digestive System, Electrolyte/Renal Profile)  REASON FOR ASSESSMENT:   Consult  (supplements)  ASSESSMENT:    Pt admitted with AMS, sepsis with aspiration pneumonia, UTI; pt with 1:1 sitter, wearing mittens, agitated, coming out of the bed early but calm on visit today. Pt with hx of dementia  Past Medical History  Diagnosis Date  . Anxiety   . Depression      Diet Order:  Diet regular Room service appropriate?: Yes; Fluid consistency:: Thin  Energy Intake: pt ate 100% of eggs, most of oatmeal, some muffin at breakfast with assistance from sitter. No recorded po intake  Food and Nutrition Related History: unable to assess  Skin:   (no pressure ulcer documented)   Digestive System: per Maralyn SagoSarah RN, pt with no issues with chewing/swallowing current diet consistency  Electrolyte and Renal Profile:  Recent Labs Lab 06/26/15 0658 06/26/15 1027 06/27/15 0605  BUN 22* 22* 20  CREATININE 1.02 1.01 0.90  NA 138 140 139  K 3.4* 3.7 3.9   Glucose Profile:   Recent Labs  06/26/15 0902  GLUCAP 96   Nutritional Anemia Profile:  CBC Latest Ref Rng 06/27/2015 06/26/2015 06/26/2015  WBC 3.8 - 10.6 K/uL 11.8(H) 12.8(H) 11.2(H)  Hemoglobin 13.0 - 18.0 g/dL 10.5(L) 9.4(L)  9.4(L)  Hematocrit 40.0 - 52.0 % 32.1(L) 29.2(L) 27.7(L)  Platelets 150 - 440 K/uL 217 206 195    Protein Profile:   Recent Labs Lab 06/25/15 1406 06/26/15 1027  ALBUMIN 3.7 2.9*   Meds: remeron, NS at 75 ml/hr  Nutrition Focused Physical Exam: Nutrition-Focused physical exam completed. Findings are moderate to severe fat depletion, moderate to severe muscle depletion, and no edema.   Height:   Ht Readings from Last 1 Encounters:  06/25/15 6\' 3"  (1.905 m)    Weight: no weight encounters in computer, pt unable to report changes in wt  Wt Readings from Last 1 Encounters:  06/25/15 133 lb (60.328 kg)   Filed Weights   06/25/15 1416  Weight: 133 lb (60.328 kg)    BMI:  Body mass index is 16.62 kg/(m^2).  Estimated Nutritional Needs:   Kcal:  1774-1935 kcals (BEE 1344, 1.2 AF, 1.1-1.2 IF) using current wt of 60.3 kg  Protein:  66-78 g (1.1-1.3 g/kg)   Fluid:  1500-1800 mL (25-30 ml/kg)   HIGH Care Level  Romelle Starcherate Foye Damron MS, RD, LDN [phone] Pager

## 2015-06-27 NOTE — Consult Note (Signed)
PULMONARY / CRITICAL CARE MEDICINE   Name: John Simon MRN: [id number] DOB: 06-27-1923    ADMISSION DATE:  06/25/2015 CONSULTATION DATE:  06/26/15  REFERRING MD :  Dr. Delfino Lovett   CHIEF COMPLAINT:    fever and cough, possible sepsis   HISTORY OF PRESENT ILLNESS - hx per chart review and family at bedside    81 y.o. male with a known history of dementia who presents with his son for above complaint. Patient son reports approximate 2 weeks ago he took his father home from an assisted living facility in which the patient was at due to agitation and wandering. Over the 2 weeks patient actually has been doing quite well. He was at [facility] only 2 days ago walking and feeling well. On 11/23 the son noticed that his father was very weak and also had a cough. He was concerned and therefore took his temperature which was read at 100. Due to weakness patient son called EMS and EMS brought the patient to the ER. In the emergency room his temp max was 103. He was treated initially with vancomycin and Zosyn for sepsis. His urinalysis does show UTI chest x-ray does not show evidence of pneumonia. Patient is agitated. Patient son also reports that over the past 2 weeks they have been trying to taper off Risperdal which was started a few weeks ago while the patient was at the assisted living facility. They feel that the Risperdal is causing the patient to be sedated and more agitated. On 11/24 patient was noted to be more somnolent, with lower BP, and concerns for becoming septic, he was transferred to the ICU for closer monitoring. His respiratory status worsened overnight, initially on 5-6L [place] then placed on NRB to maintain sats>88%. CT chest showed - bilateral basilar inflitrates, R>L - possible aspiration.   SUBJECTIVE Patient awake and alert this morning, very lucid, and interactive. States that he has a cough, overall feeling better   SIGNIFICANT EVENTS   11/24>>possible asp PNA, transferred to  ICU  PAST MEDICAL HISTORY    :  Past Medical History  Diagnosis Date  . Anxiety   . Depression    History reviewed. No pertinent past surgical history. Prior to Admission medications   Medication Sig Start Date End Date Taking? Authorizing Provider  Melatonin 3 MG TABS Take 3 mg by mouth at bedtime.   Yes Historical Provider, MD  mirtazapine (REMERON) 15 MG tablet Take 15 mg by mouth at bedtime.   Yes Historical Provider, MD  polyethylene glycol (MIRALAX / GLYCOLAX) packet Take 17 g by mouth daily.   Yes Historical Provider, MD  risperiDONE (RISPERDAL) 1 MG tablet Take 0.5 mg by mouth at bedtime.   Yes Historical Provider, MD  Vitamin D, Ergocalciferol, (DRISDOL) 50000 UNITS CAPS capsule Take 50,000 Units by mouth once a week. On Tuesday night   Yes Historical Provider, MD   No Known Allergies   FAMILY HISTORY   History reviewed. No pertinent family history.    SOCIAL HISTORY    reports that he has never smoked. He does not have any smokeless tobacco history on file. He reports that he does not drink alcohol. His drug history is not on file.  Review of Systems  Constitutional: Negative for fever and chills.  HENT: Negative for hearing loss.   Eyes: Negative for double vision and pain.  Respiratory: Positive for cough and sputum production. Negative for hemoptysis, shortness of breath and wheezing.   Cardiovascular: Negative for chest pain,  palpitations and orthopnea.  Skin: Negative for itching and rash.  Neurological: Negative for headaches.      VITAL SIGNS    Temp:  [97.6 F (36.4 C)-100.3 F (37.9 C)] 99 F (37.2 C) (11/25 0739) Pulse Rate:  [65-103] 97 (11/25 0739) Resp:  [13-25] 16 (11/25 0739) BP: (79-161)/(44-92) 149/87 mmHg (11/25 0700) SpO2:  [87 %-100 %] 97 % (11/25 0739) HEMODYNAMICS:   VENTILATOR SETTINGS:   INTAKE / OUTPUT:  Intake/Output Summary (Last 24 hours) at 06/27/15 1610 Last data filed at 06/27/15 0700  Gross per 24 hour  Intake  3907.08 ml  Output      0 ml  Net 3907.08 ml       PHYSICAL EXAM   Physical Exam  Constitutional: He appears well-developed.  HENT:  Head: Normocephalic and atraumatic.  Right Ear: External ear normal.  Left Ear: External ear normal.  Eyes: Conjunctivae are normal. Pupils are equal, round, and reactive to light.  Neck: Neck supple. No thyromegaly present.  Cardiovascular: Normal rate, regular rhythm and normal heart sounds.   Pulmonary/Chest: Effort normal.  Coarse upper airway sounds Cough No wheezes  Abdominal: Soft.  Neurological:  Alert this AM  Skin: Skin is warm and dry.       LABS   LABS:  CBC  Recent Labs Lab 06/26/15 0658 06/26/15 1027 06/27/15 0605  WBC 11.2* 12.8* 11.8*  HGB 9.4* 9.4* 10.5*  HCT 27.7* 29.2* 32.1*  PLT 195 206 217   Coag's  Recent Labs Lab 06/26/15 1027  APTT 43*  INR 1.38   BMET  Recent Labs Lab 06/26/15 0658 06/26/15 1027 06/27/15 0605  NA 138 140 139  K 3.4* 3.7 3.9  CL 108 109 109  CO2 23 24 24   BUN 22* 22* 20  CREATININE 1.02 1.01 0.90  GLUCOSE 101* 101* 97   Electrolytes  Recent Labs Lab 06/26/15 0658 06/26/15 1027 06/27/15 0605  CALCIUM 7.8* 7.8* 8.2*   Sepsis Markers  Recent Labs Lab 06/25/15 1405 06/25/15 1926 06/26/15 1027 06/26/15 1033  LATICACIDVEN 1.2 0.9  --  1.1  PROCALCITON  --   --  4.64  --    ABG  Recent Labs Lab 06/26/15 0825  PHART 7.42  PCO2ART 37  PO2ART 57*   Liver Enzymes  Recent Labs Lab 06/25/15 1406 06/26/15 1027  AST 19 34  ALT 11* 13*  ALKPHOS 78 50  BILITOT 0.9 0.6  ALBUMIN 3.7 2.9*   Cardiac Enzymes  Recent Labs Lab 06/26/15 1027  TROPONINI 0.14*   Glucose  Recent Labs Lab 06/26/15 0902  GLUCAP 96     Recent Results (from the past 240 hour(s))  Culture, blood (routine x 2)     Status: None (Preliminary result)   Collection Time: 06/25/15  2:02 PM  Result Value Ref Range Status   Specimen Description BLOOD RIGHT ASSIST CONTROL   Final   Special Requests BOTTLES DRAWN AEROBIC AND ANAEROBIC 5CC  Final   Culture NO GROWTH 2 DAYS  Final   Report Status PENDING  Incomplete  Urine culture     Status: None (Preliminary result)   Collection Time: 06/25/15  2:05 PM  Result Value Ref Range Status   Specimen Description URINE, RANDOM  Final   Special Requests NONE  Final   Culture NO GROWTH < 24 HOURS  Final   Report Status PENDING  Incomplete  Culture, blood (routine x 2)     Status: None (Preliminary result)   Collection Time: 06/25/15  2:06 PM  Result Value Ref Range Status   Specimen Description BLOOD LEFT WRIST  Final   Special Requests   Final    BOTTLES DRAWN AEROBIC AND ANAEROBIC  5CC ANAERO 6CC AERO   Culture NO GROWTH 2 DAYS  Final   Report Status PENDING  Incomplete  MRSA PCR Screening     Status: None   Collection Time: 06/26/15  9:35 AM  Result Value Ref Range Status   MRSA by PCR NEGATIVE NEGATIVE Final    Comment:        The GeneXpert MRSA Assay (FDA approved for NASAL specimens only), is one component of a comprehensive MRSA colonization surveillance program. It is not intended to diagnose MRSA infection nor to guide or monitor treatment for MRSA infections.   Culture, blood (routine x 2)     Status: None (Preliminary result)   Collection Time: 06/26/15 10:20 AM  Result Value Ref Range Status   Specimen Description BLOOD LEFT AC  Final   Special Requests   Final    BOTTLES DRAWN AEROBIC AND ANAEROBIC  AER 4CC ANA 1CC   Culture NO GROWTH < 24 HOURS  Final   Report Status PENDING  Incomplete  Culture, blood (routine x 2)     Status: None (Preliminary result)   Collection Time: 06/26/15 10:33 AM  Result Value Ref Range Status   Specimen Description BLOOD RIGHT AC  Final   Special Requests   Final    BOTTLES DRAWN AEROBIC AND ANAEROBIC  AER 1CC ANA 3CC   Culture NO GROWTH < 24 HOURS  Final   Report Status PENDING  Incomplete     Current facility-administered medications:  .  0.9 %   sodium chloride infusion, , Intravenous, Continuous, Adrian Saran, MD, Last Rate: 75 mL/hr at 06/26/15 0939 .  acetaminophen (TYLENOL) tablet 650 mg, 650 mg, Oral, Q6H PRN **OR** acetaminophen (TYLENOL) suppository 650 mg, 650 mg, Rectal, Q6H PRN, Adrian Saran, MD, 650 mg at 06/26/15 0503 .  alum & mag hydroxide-simeth (MAALOX/MYLANTA) 200-200-20 MG/5ML suspension 30 mL, 30 mL, Oral, Q6H PRN, Adrian Saran, MD .  enoxaparin (LOVENOX) injection 40 mg, 40 mg, Subcutaneous, Q24H, Sital Mody, MD, 40 mg at 06/26/15 2236 .  HYDROcodone-acetaminophen (NORCO/VICODIN) 5-325 MG per tablet 1-2 tablet, 1-2 tablet, Oral, Q4H PRN, Adrian Saran, MD .  mirtazapine (REMERON) tablet 15 mg, 15 mg, Oral, QHS, Adrian Saran, MD, 15 mg at 06/26/15 2305 .  ondansetron (ZOFRAN) tablet 4 mg, 4 mg, Oral, Q6H PRN **OR** ondansetron (ZOFRAN) injection 4 mg, 4 mg, Intravenous, Q6H PRN, Sital Mody, MD .  piperacillin-tazobactam (ZOSYN) IVPB 3.375 g, 3.375 g, Intravenous, 3 times per day, Cindi Carbon, RPH, 3.375 g at 06/27/15 0234 .  polyethylene glycol (MIRALAX / GLYCOLAX) packet 17 g, 17 g, Oral, Daily, Adrian Saran, MD, 17 g at 06/26/15 0843 .  risperiDONE (RISPERDAL) tablet 0.5 mg, 0.5 mg, Oral, QHS, Sital Mody, MD, 0.5 mg at 06/26/15 2305 .  senna-docusate (Senokot-S) tablet 1 tablet, 1 tablet, Oral, QHS PRN, Sital Mody, MD .  sodium chloride 0.9 % injection 3 mL, 3 mL, Intravenous, Q12H, Sital Mody, MD, 3 mL at 06/26/15 2200 .  vancomycin (VANCOCIN) IVPB 750 mg/150 ml premix, 750 mg, Intravenous, Q18H, Delfino Lovett, MD, 750 mg at 06/26/15 2100 .  [START ON 07/01/2015] Vitamin D (Ergocalciferol) (DRISDOL) capsule 50,000 Units, 50,000 Units, Oral, Weekly, Adrian Saran, MD  IMAGING    Ct Chest Wo Contrast  06/26/2015  CLINICAL DATA:  Cough and  fever EXAM: CT CHEST WITHOUT CONTRAST TECHNIQUE: Multidetector CT imaging of the chest was performed following the standard protocol without IV contrast. COMPARISON:  06/26/2015 FINDINGS: Lungs are  well aerated with bibasilar infiltrate identified. This is slightly worse on the right than the left similar to that seen on recent chest x-ray. Calcified granuloma is not noted in the left lower lobe. The thoracic inlet is within normal limits. Calcifications of the thoracic aorta are noted. No hilar or mediastinal adenopathy is seen. No significant coronary calcifications are noted. The upper abdomen is within normal limits. The osseous structures show no acute abnormality. IMPRESSION: Bibasilar infiltrates right greater than left. Changes consistent with prior granulomatous disease. No other acute abnormality is noted. Electronically Signed   By: Alcide CleverMark  Lukens M.D.   On: 06/26/2015 09:06      Indwelling Urinary Catheter continued, requirement due to   Reason to continue Indwelling Urinary Catheter for strict Intake/Output monitoring for hemodynamic instability   Central Line continued, requirement due to   Reason to continue [employer] Monitoring of central venous pressure or other hemodynamic parameters   Ventilator continued, requirement due to, resp failure    Ventilator Sedation RASS 0 to -2   Cultures: BCx2 11/24>> UC 11/24>> Sputum  Antibiotics: Zosyn 11/24>> Vanc 11/24>>  Lines:   ASSESSMENT/PLAN  89 with mild dementia, now with possible aspiration PNA, now with rapid clinical improvement.   PULMONARY A: Acute Resp Failure - improving Aspiration Pneumonia - improving Bilateral basilar PNA Sepsis - improving P:   - cont with antibiotics - f\u blood cultures -  maintain sats >88%, may use supplemental O2  - CT Chest - R>L basilar opacities - F\U cultures - cont with medical treatment, family stated that patient is a DNR  CARDIOVASCULAR -hemodynamic monitoring   RENAL A:   ?UTI P:   - cont with vanc\zosyn - monitor I\O - f\u UOP - f\u urine cx  GASTROINTESTINAL -PPI  HEMATOLOGIC - CBC    INFECTIOUS A:  Sepsis Aspiration PNA\Basilar PNA P:    - cont with abx - f\u  cultures  NEUROLOGIC A:  Mild dementia AMS - improved P:   RASS goal: 0 - Alert and awake this morning - stable to transfer to medical floor - limit benzos and antipsychotics   SOCIAL  - Family (son and daughter-in-law) updated on patient's current clinical status, they are not opposed to short-term intubation for 1-2 days, however after prolonged discussion and given patient's underlying comorbidities, they have decided on a full DO NOT RESUSCITATE, they wish to proceed with supportive medical care in the form of antibiotics, gentle IV fluids, pressors if needed.   Critical Care Time devoted to patient care services described in this note is 35 minutes.   Stephanie AcreVishal Maxemiliano Riel, MD [facility] Pager [phone] (please enter 7-digits) On Call Pager - [phone] (please enter 7-digits)   06/27/2015, 8:33 AM  Note: This note was prepared with Dragon dictation along with smaller phrase technology. Any transcriptional errors that result from this process are unintentional.

## 2015-06-27 NOTE — Progress Notes (Signed)
Pharmacy Antibiotic Time-Out Note  John Simon is a 81 y.o. year-old male admitted on 06/25/2015.  The patient is currently on Zosyn 3.375g IV Q8hr for aspiration PNA.  Assessment/Plan: After discussion with MD, will continue patient on Unasyn 3g IV Q6hr with plans to change to Augmentin at discharge.   Temp (24hrs), Avg:98.7 F (37.1 C), Min:97.6 F (36.4 C), Max:100.3 F (37.9 C)   Recent Labs Lab 06/25/15 1406 06/25/15 1919 06/26/15 0658 06/26/15 1027 06/27/15 0605  WBC 15.3* 13.6* 11.2* 12.8* 11.8*    Recent Labs Lab 06/25/15 1406 06/25/15 1919 06/26/15 0658 06/26/15 1027 06/27/15 0605  CREATININE 1.10 1.06 1.02 1.01 0.90   Estimated Creatinine Clearance: 46.5 mL/min (by C-G formula based on Cr of 0.9).   Antimicrobial allergies: No Known  Antimicrobials this admission: Zosyn 11/24 - 11/25 Vancomycin 11/24 - 11/25 Unasyn 11/25-   Thank you for allowing pharmacy to be a part of this patient's care.  )Joanell Cressler L PharmD 06/27/2015 4:08 PM

## 2015-06-27 NOTE — Progress Notes (Signed)
[facility] Patient to move to room 143. Son at bedside and notified of transfer. Sitter still at bedside. Medications sent through tube system.

## 2015-06-27 NOTE — Care Management Important Message (Signed)
Important Message  Patient Details  Name: Drake LeachLowell Lackman MRN: [id number] Date of Birth: 02/27/1923   Medicare Important Message Given:  Yes    Jamika Sadek A, RN 06/27/2015, 2:53 PM

## 2015-06-27 NOTE — Evaluation (Signed)
Clinical/Bedside Swallow Evaluation Patient Details  Name: John LeachLowell Mineer MRN: [id number] Date of Birth: 12/02/1922  Today's Date: 06/27/2015 Time: SLP Start Time (ACUTE ONLY): 1200 SLP Stop Time (ACUTE ONLY): 1300 SLP Time Calculation (min) (ACUTE ONLY): 60 min  Past Medical History:  Past Medical History  Diagnosis Date  . Anxiety   . Depression    Past Surgical History: History reviewed. No pertinent past surgical history. HPI:  John Simon is a 81 y.o. male with a known history of dementia who presents with his son for above complaint. Patient son reports approximate 2 weeks ago he took his father home from an assisted living facility in which the patient was at due to agitation and wandering. Over the 2 weeks patient actually has been doing quite well. [facility] and feeling well. This morning the son noticed that his father was very weak and also had a cough. He was concerned and therefore took his temperature which was read at 100. Due to weakness patient son called EMS and EMS brought the patient to the ER. In the emergency room his temp max was 103. He was treated initially with vancomycin and Zosyn for sepsis. His urinalysis does show UTI chest x-ray does not show evidence of pneumonia. Patient is agitated. Patient son also reports that over the past 2 weeks they have been trying to taper off Risperdal which was started a few weeks ago while the patient was at the assisted living facility. They feel that the Risperdal is causing the patient to be sedated and more agitated. Pt's family and dietitan report there is some concern of aspiration and would like to make sure pt is safe to eat and drink.   Assessment / Plan / Recommendation Clinical Impression  Pt presents with mild aspiration risk d/t cognitive deficits. Pt demonstrated no overt s/s of aspiration with any tested consistency, however pt was very distractible and often needed cues to attend to bite in  mouth. Pt given several sips of thin via cup and straw, several bites of puree, bite of solid and several bites of mech soft. Pt was able to adequately clear all bites/sips and oral phase appeared [facility], except for pt's intermittent distractability. No overt coughing or throat clearing was observed. Vocal quality remained clear throughout trials. O2 and respiratory rate remained stable. Recommend Dysphagia III diet w/thin liquids and strict aspiration precautions. Will f/u re: toleration of diet in 1-2 days.     Aspiration Risk  Mild aspiration risk    Diet Recommendation Dysphagia 3 (Mech soft);Thin liquid   Liquid Administration via: Cup;Straw Medication Administration: Whole meds with puree Supervision: Full supervision/cueing for compensatory strategies Compensations: Minimize environmental distractions;Slow rate;Small sips/bites Postural Changes: Seated upright at 90 degrees    Other  Recommendations Oral Care Recommendations: Oral care BID;Staff/trained caregiver to provide oral care   Follow up Recommendations  24 hour supervision/assistance;Skilled Nursing facility    Frequency and Duration min 3x week  1 week       Prognosis Prognosis for Safe Diet Advancement: Fair Barriers to Reach Goals: Cognitive deficits      Swallow Study   General Date of Onset: 06/25/15 HPI: John Simon is a [age] y.o. male with a known history of dementia who presents with his son for above complaint. Patient son reports approximate 2 weeks ago he took his father home from an assisted living facility in which the patient was at due to agitation and wandering. Over the 2  weeks patient actually has been doing quite well. He was at [facility] only 2 days ago walking and feeling well. This morning the son noticed that his father was very weak and also had a cough. He was concerned and therefore took his temperature which was read at 100. Due to weakness patient son called EMS and EMS brought the patient to the ER.  In the emergency room his temp max was 103. He was treated initially with vancomycin and Zosyn for sepsis. His urinalysis does show UTI chest x-ray does not show evidence of pneumonia. Patient is agitated. Patient son also reports that over the past 2 weeks they have been trying to taper off Risperdal which was started a few weeks ago while the patient was at the assisted living facility. They feel that the Risperdal is causing the patient to be sedated and more agitated. Pt's family and dietitan report there is some concern of aspiration and would like to make sure pt is safe to eat and drink. Type of Study: Bedside Swallow Evaluation Previous Swallow Assessment: None reported Diet Prior to this Study: Regular;Thin liquids Temperature Spikes Noted: N/A Respiratory Status: Nasal cannula History of Recent Intubation: No Behavior/Cognition: Alert;Pleasant mood;Confused;Distractible;Doesn't follow directions Oral Cavity Assessment: Within Functional Limits Oral Care Completed by SLP: No Oral Cavity - Dentition: Adequate natural dentition Vision: Functional for self-feeding Self-Feeding Abilities: Total assist Patient Positioning: Upright in chair Baseline Vocal Quality: Normal Volitional Cough: Strong Volitional Swallow: Able to elicit    Oral/Motor/Sensory Function Overall Oral Motor/Sensory Function: Other (comment) (Pt with difficulty following all commands, but grossly assessed to be [facility])   Ice Chips Ice chips: Within functional limits Presentation: Spoon   Thin Liquid Thin Liquid: Within functional limits Presentation: Straw;Cup    Nectar Thick Nectar Thick Liquid: Not tested   Honey Thick Honey Thick Liquid: Not tested   Puree Puree: Within functional limits Presentation: Spoon   Solid Solid: Within functional limits Other Comments: Solid and mechanical soft       [place],Avannah Decker 06/27/2015,1:29 PM

## 2015-06-27 NOTE — Evaluation (Signed)
Physical Therapy Evaluation Patient Details Name: Drake LeachLowell Stickels MRN: [id number] DOB: 12/30/1922 Today's Date: 06/27/2015   History of Present Illness  Pt here with sepsis AMS, needed CCU care now feeling better but still very confused.   Clinical Impression  Pt very confused and distracted and it is difficult to get him to meaningfully follow any instructions. He is grabbing at his IV/pulse ox/etc t/o the session and needs constant redirecting.  He was able to do some standing but with his confusion is not safe and needs close supervision and often at least min assist for safety in standing.  Pt appears to have functional strength but formal testing is very difficult.  Discussed with son that pt may not be able to participate with formal PT, but discussed that getting into a regular walking routine with son insuring safety (2/2 mental status).      Follow Up Recommendations  (per ability to participate, did discuss HEP with son)    Equipment Recommendations       Recommendations for Other Services       Precautions / Restrictions Precautions Precautions: Fall Restrictions Weight Bearing Restrictions: No      Mobility  Bed Mobility Overal bed mobility: Needs Assistance Bed Mobility: Supine to Sit;Sit to Supine     Supine to sit: Mod assist Sit to supine: Mod assist   General bed mobility comments: Pt appears to make an effort to get up but them seems distracted and after a few trials of trying to get up ultimately needs assist.  Transfers Overall transfer level: Needs assistance Equipment used: Rolling walker (2 wheeled) Transfers: Sit to/from Stand Sit to Stand: Mod assist (pt confused and does not follow cuing very well)            Ambulation/Gait Ambulation/Gait assistance: Min assist Ambulation Distance (Feet): 5 Feet Assistive device: None;1 person hand held assist;2 person hand held assist;Rolling walker (2 wheeled)       General Gait Details: Pt very  confused and distracted t/o entire standing/ambulation/marching in place/etc bout. He was not safe secondary to his mental status and needed constant direct physical cuing to insure that he didn't do something potentially dangerous to himself.  He is able to maintain balance most of the time w/ only CGA, but is impulsive and unsteady and generally not appropriate with decision making  Stairs            Wheelchair Mobility    Modified Rankin (Stroke Patients Only)       Balance Overall balance assessment: Needs assistance (secondary to mental status)                                           Pertinent Vitals/Pain Pain Assessment: No/denies pain    Home Living Family/patient expects to be discharged to:: Private residence Living Arrangements: Children Available Help at Discharge: Family           Home Equipment: Dan HumphreysWalker - 2 wheels;Cane - quad      Prior Function Level of Independence: Needs assistance (secondary to mental status)         Comments: apparently pt walks regularly with son and was able to walk "about a mile" with a shopping cart recently     Hand Dominance        Extremity/Trunk Assessment   Upper Extremity Assessment: Overall WFL for tasks assessed (does not follow  formal testing)           Lower Extremity Assessment: Overall WFL for tasks assessed (unable to follow with formal testing)         Communication   Communication:  (confused, distracted speech t/o session)  Cognition Arousal/Alertness:  (confused, distracted) Behavior During Therapy: Restless Overall Cognitive Status: Impaired/Different from baseline (has baseline confusion, worse than normal)                      General Comments      Exercises        Assessment/Plan    PT Assessment Patient needs continued PT services  PT Diagnosis Difficulty walking;Altered mental status;Generalized weakness   PT Problem List Decreased  strength;Decreased activity tolerance;Decreased balance;Decreased coordination;Decreased mobility;Decreased cognition;Decreased knowledge of use of DME;Decreased safety awareness  PT Treatment Interventions Therapeutic exercise;Stair training;Gait training;Therapeutic activities;Balance training;Functional mobility training;DME instruction;Neuromuscular re-education;Cognitive remediation;Patient/family education   PT Goals (Current goals can be found in the Care Plan section) Acute Rehab PT Goals Patient Stated Goal: son would like for him to get back to longer walking PT Goal Formulation: With family Time For Goal Achievement: 06/27/15 Potential to Achieve Goals: Fair    Frequency Min 2X/week   Barriers to discharge        Co-evaluation               End of Session Equipment Utilized During Treatment: Gait belt Activity Tolerance:  (limited due to confusion) Patient left: with nursing/sitter in room;in bed           Time: 1610-9604 PT Time Calculation (min) (ACUTE ONLY): 27 min   Charges:   PT Evaluation $Initial PT Evaluation Tier I: 1 Procedure     PT G Codes:       Loran Senters, PT, DPT [phone]  Malachi Pro 06/27/2015, 4:01 PM

## 2015-06-27 NOTE — Progress Notes (Signed)
ANTIBIOTIC CONSULT NOTE - Follow up  Pharmacy Consult for piperacillin/tazobactam  Indication: rule out sepsis  No Known Allergies  Patient Measurements: Height: 6\' 3"  (190.5 cm) Weight: 133 lb (60.328 kg) IBW/kg (Calculated) : 84.5  Vital Signs: Temp: 99 F (37.2 C) (11/25 0739) Temp Source: Axillary (11/25 0739) BP: 149/87 mmHg (11/25 0700) Pulse Rate: 97 (11/25 0739) Intake/Output from previous day: 11/24 0701 - 11/25 0700 In: 3907.1 [I.V.:2723.8; IV Piggyback:1183.3] Out: -  Intake/Output from this shift:    Labs:  Recent Labs  06/26/15 0658 06/26/15 1027 06/27/15 0605  WBC 11.2* 12.8* 11.8*  HGB 9.4* 9.4* 10.5*  PLT 195 206 217  CREATININE 1.02 1.01 0.90   Estimated Creatinine Clearance: 46.5 mL/min (by C-G formula based on Cr of 0.9). No results for input(s): VANCOTROUGH, VANCOPEAK, VANCORANDOM, GENTTROUGH, GENTPEAK, GENTRANDOM, TOBRATROUGH, TOBRAPEAK, TOBRARND, AMIKACINPEAK, AMIKACINTROU, AMIKACIN in the last 72 hours.   Microbiology: Recent Results (from the past 720 hour(s))  Culture, blood (routine x 2)     Status: None (Preliminary result)   Collection Time: 06/25/15  2:02 PM  Result Value Ref Range Status   Specimen Description BLOOD RIGHT ASSIST CONTROL  Final   Special Requests BOTTLES DRAWN AEROBIC AND ANAEROBIC 5CC  Final   Culture NO GROWTH 2 DAYS  Final   Report Status PENDING  Incomplete  Urine culture     Status: None   Collection Time: 06/25/15  2:05 PM  Result Value Ref Range Status   Specimen Description URINE, RANDOM  Final   Special Requests NONE  Final   Culture NO GROWTH 2 DAYS  Final   Report Status 06/27/2015 FINAL  Final  Culture, blood (routine x 2)     Status: None (Preliminary result)   Collection Time: 06/25/15  2:06 PM  Result Value Ref Range Status   Specimen Description BLOOD LEFT WRIST  Final   Special Requests   Final    BOTTLES DRAWN AEROBIC AND ANAEROBIC  5CC ANAERO 6CC AERO   Culture NO GROWTH 2 DAYS  Final   Report Status PENDING  Incomplete  MRSA PCR Screening     Status: None   Collection Time: 06/26/15  9:35 AM  Result Value Ref Range Status   MRSA by PCR NEGATIVE NEGATIVE Final    Comment:        The GeneXpert MRSA Assay (FDA approved for NASAL specimens only), is one component of a comprehensive MRSA colonization surveillance program. It is not intended to diagnose MRSA infection nor to guide or monitor treatment for MRSA infections.   Culture, blood (routine x 2)     Status: None (Preliminary result)   Collection Time: 06/26/15 10:20 AM  Result Value Ref Range Status   Specimen Description BLOOD LEFT AC  Final   Special Requests   Final    BOTTLES DRAWN AEROBIC AND ANAEROBIC  AER 4CC ANA 1CC   Culture NO GROWTH < 24 HOURS  Final   Report Status PENDING  Incomplete  Culture, blood (routine x 2)     Status: None (Preliminary result)   Collection Time: 06/26/15 10:33 AM  Result Value Ref Range Status   Specimen Description BLOOD RIGHT AC  Final   Special Requests   Final    BOTTLES DRAWN AEROBIC AND ANAEROBIC  AER 1CC ANA 3CC   Culture NO GROWTH < 24 HOURS  Final   Report Status PENDING  Incomplete    Medical History: Past Medical History  Diagnosis Date  . Anxiety   .  Depression     Medications:  Anti-infectives    Start     Dose/Rate Route Frequency Ordered Stop   06/26/15 2130  vancomycin (VANCOCIN) IVPB 750 mg/150 ml premix  Status:  Discontinued     750 mg 150 mL/hr over 60 Minutes Intravenous Every 18 hours 06/26/15 1246 06/27/15 1122   06/26/15 1800  vancomycin (VANCOCIN) IVPB 750 mg/150 ml premix  Status:  Discontinued     750 mg 150 mL/hr over 60 Minutes Intravenous Every 18 hours 06/26/15 0836 06/26/15 1246   06/26/15 1400  piperacillin-tazobactam (ZOSYN) IVPB 3.375 g     3.375 g 12.5 mL/hr over 240 Minutes Intravenous 3 times per day 06/26/15 0834 06/30/15 1700   06/26/15 1230  vancomycin (VANCOCIN) IVPB 1000 mg/200 mL premix     1,000 mg 200 mL/hr  over 60 Minutes Intravenous  Once 06/26/15 1225 06/26/15 1349   06/26/15 0900  piperacillin-tazobactam (ZOSYN) IVPB 3.375 g     3.375 g 100 mL/hr over 30 Minutes Intravenous  Once 06/26/15 0756 06/26/15 1008   06/26/15 0900  vancomycin (VANCOCIN) IVPB 1000 mg/200 mL premix  Status:  Discontinued     1,000 mg 200 mL/hr over 60 Minutes Intravenous  Once 06/26/15 0756 06/26/15 1224   06/25/15 2000  cefTRIAXone (ROCEPHIN) 1 g in dextrose 5 % 50 mL IVPB  Status:  Discontinued     1 g 100 mL/hr over 30 Minutes Intravenous Every 24 hours 06/25/15 1834 06/26/15 0827   06/25/15 1900  azithromycin (ZITHROMAX) 500 mg in dextrose 5 % 250 mL IVPB  Status:  Discontinued     500 mg 250 mL/hr over 60 Minutes Intravenous Every 24 hours 06/25/15 1834 06/26/15 0827   06/25/15 1445  vancomycin (VANCOCIN) IVPB 1000 mg/200 mL premix     1,000 mg 200 mL/hr over 60 Minutes Intravenous  Once 06/25/15 1436 06/25/15 1550   06/25/15 1445  piperacillin-tazobactam (ZOSYN) IVPB 3.375 g     3.375 g 12.5 mL/hr over 240 Minutes Intravenous  Once 06/25/15 1436 06/25/15 1848     Assessment: Pharmacy consulted to dose vancomycin and piperacillin/tazobactam in this 81 year old male for sepsis on 06/26/15. Patient was admitted on 11/23 and started on  azithromycin and ceftriaxone. Vancomycin 750 IV every 18 hours started, MRSA PCR negative.  Patient now being treated for aspiration PNA  Plan:  Vancomycin D'cd during rounds.  Will continue  Piperacillin/tazobactam 3.375 g IV q8h EI. Added stop date of total of 5 days per Dr. Dema Severin. Pharmacy will continue to monitor renal function and labs and make adjustments as needed.   Cher Nakai, PharmD Pharmacy Resident 06/27/2015 12:04 PM

## 2015-06-27 NOTE — Care Management Note (Addendum)
Case Management Note  Patient Details  Name: Drake LeachLowell Kielty MRN: [id number] Date of Birth: 02/12/1923  Subjective/Objective:    A Case management assessment was completed earlier today in CCU with son and [name]. Currently a sitter is at bedside.  [name] remains confused. Case management will continue to follow for discharge planning. Son has verbalized a desire to take [name] home with him after hospital discharge with home health services from [facility].               Action/Plan:   Expected Discharge Date:                  Expected Discharge Plan:  Home w Home Health Services  In-House Referral:     Discharge planning Services  CM Consult  Post Acute Care Choice:  Home Health Choice offered to:  Adult Children  DME Arranged:    DME Agency:  [facility]  HH Arranged:  RN, PT, Nurse's Aide, Social Work [employer] Agency:     Status of Service:  In process, will continue to follow  Medicare Important Message Given:  Yes Date Medicare IM Given:    Medicare IM give by:    Date Additional Medicare IM Given:    Additional Medicare Important Message give by:     If discussed at Long Length of Stay Meetings, dates discussed:    Additional Comments:  Arel Tippen A, RN 06/27/2015, 2:56 PM

## 2015-06-27 NOTE — Progress Notes (Signed)
[facility] - [place] at [facility]   PATIENT NAME: John Simon    MR#:  [id number]  DATE OF BIRTH:  1923-02-11  SUBJECTIVE:  CHIEF COMPLAINT:   Chief Complaint  Patient presents with  . Altered Mental Status  . Weakness   looking much better, awake and talking, although seems pleasantly confused  REVIEW OF SYSTEMS:  Review of Systems  Unable to perform ROS: critical illness   DRUG ALLERGIES:  No Known Allergies VITALS:  Blood pressure 149/87, pulse 97, temperature 99 F (37.2 C), temperature source Axillary, resp. rate 16, height  (1.905 m), weight 60.328 kg (133 lb), SpO2 97 %. PHYSICAL EXAMINATION:  Physical Exam  Constitutional: He appears malnourished. He appears cachectic. He has a sickly appearance.  HENT:  Head: Normocephalic and atraumatic.  Eyes: Conjunctivae and EOM are normal. Pupils are equal, round, and reactive to light.  Neck: Normal range of motion. Neck supple. No tracheal deviation present. No thyromegaly present.  Cardiovascular: Normal rate, regular rhythm and normal heart sounds.   Pulmonary/Chest: No accessory muscle usage. Tachypnea noted. No respiratory distress. He has decreased breath sounds in the right middle field and the right lower field. He has no wheezes. He has rhonchi in the right lower field. He exhibits no tenderness.  Abdominal: Soft. Bowel sounds are normal. He exhibits no distension. There is no tenderness.  Musculoskeletal: Normal range of motion.  Neurological: He is alert. He is disoriented. No cranial nerve deficit.  Pleasantly confused  Skin: Skin is warm and dry. No rash noted.  Psychiatric:  Pleasantly confused   LABORATORY PANEL:   CBC  Recent Labs Lab 06/27/15 0605  WBC 11.8*  HGB 10.5*  HCT 32.1*  PLT 217   ------------------------------------------------------------------------------------------------------------------ Chemistries   Recent Labs Lab 06/26/15 1027 06/27/15 0605   NA 140 139  K 3.7 3.9  CL 109 109  CO2 24 24  GLUCOSE 101* 97  BUN 22* 20  CREATININE 1.01 0.90  CALCIUM 7.8* 8.2*  AST 34  --   ALT 13*  --   ALKPHOS 50  --   BILITOT 0.6  --    RADIOLOGY:  No results found. ASSESSMENT AND PLAN:  81 year old male with a history of dementia who presents from home with a cough and fever and clinical sepsis.  * Septic shock: likely due to aspiration pneumonia confirmed on CT scan.  Stop vancomycin.  Continue Zosyn - and switch over to oral Augmentin on discharge.  * Acute resp failure: Improving.  Requiring 3 L oxygen via nasal cannula.  Due to pneumonia   * Acute Metabolic encephalopathy: improving.  Could be due to sepsis.  We will start Risperdal as son concerned about side effects   * Possible Urinary tract infection without hematuria: continue zosyn. follow up on urine culture.  * Dementia with agitation: stop Risperdal per son's request.  Sitter at bedside     All the records are reviewed and case discussed with Care Management/Social Worker. Management plans discussed with the patient, family (SON) and they are in agreement.  CODE STATUS: DNR  TOTAL TIME TAKING CARE OF THIS PATIENT: 45 minutes.   High risk for cardio-pulmo arrest and multi-organ failure and possible death.  More than 50% of the time was spent in counseling/coordination of care: YES (d/w son at bedside and updated him)  POSSIBLE D/C IN 1-2 DAYS, DEPENDING ON CLINICAL CONDITION. get physical therapy evaluation. .  May need placement    [facility], Estephani Popper M.D  on 06/27/2015 at 1:01 PM  Between 7am to 6pm - Pager - [phone]  After 6pm go to [email or web address] - password [email or web address] [facility]  [facility]  Office  [phone]  CC: Primary care physician; No primary care provider on file.

## 2015-06-28 ENCOUNTER — Inpatient Hospital Stay: Payer: Medicare Other

## 2015-06-28 DIAGNOSIS — E43 Unspecified severe protein-calorie malnutrition: Secondary | ICD-10-CM

## 2015-06-28 LAB — CBC
HCT: 29.2 % — ABNORMAL LOW (ref 40.0–52.0)
Hemoglobin: 9.6 g/dL — ABNORMAL LOW (ref 13.0–18.0)
MCH: 30 pg (ref 26.0–34.0)
MCHC: 32.9 g/dL (ref 32.0–36.0)
MCV: 91.4 fL (ref 80.0–100.0)
Platelets: 219 10*3/uL (ref 150–440)
RBC: 3.2 MIL/uL — ABNORMAL LOW (ref 4.40–5.90)
RDW: 13.4 % (ref 11.5–14.5)
WBC: 8.8 10*3/uL (ref 3.8–10.6)

## 2015-06-28 LAB — BASIC METABOLIC PANEL
Anion gap: 7 (ref 5–15)
BUN: 19 mg/dL (ref 6–20)
CO2: 23 mmol/L (ref 22–32)
Calcium: 8 mg/dL — ABNORMAL LOW (ref 8.9–10.3)
Chloride: 109 mmol/L (ref 101–111)
Creatinine, Ser: 0.84 mg/dL (ref 0.61–1.24)
GFR calc Af Amer: >60 mL/min (ref >60)
GFR calc non Af Amer: >60 mL/min (ref >60)
Glucose, Bld: 84 mg/dL (ref 65–99)
Potassium: 3.2 mmol/L — ABNORMAL LOW (ref 3.5–5.1)
Sodium: 139 mmol/L (ref 135–145)

## 2015-06-28 NOTE — Progress Notes (Signed)
[facility] - [place] at [facility]   PATIENT NAME: John Simon    MR#:  [id number]  DATE OF BIRTH:  20-Feb-1923  SUBJECTIVE:  CHIEF COMPLAINT:   Chief Complaint  Patient presents with  . Altered Mental Status  . Weakness  sleepy. Sitter at bedside.  REVIEW OF SYSTEMS:  Review of Systems  Unable to perform ROS: critical illness   DRUG ALLERGIES:  No Known Allergies VITALS:  Blood pressure 116/53, pulse 83, temperature 97.6 F (36.4 C), temperature source Oral, resp. rate 20, height  (1.905 m), weight 60.328 kg (133 lb), SpO2 100 %. PHYSICAL EXAMINATION:  Physical Exam  Constitutional: He appears malnourished. He appears cachectic. He has a sickly appearance.  HENT:  Head: Normocephalic and atraumatic.  Eyes: Conjunctivae and EOM are normal. Pupils are equal, round, and reactive to light.  Neck: Normal range of motion. Neck supple. No tracheal deviation present. No thyromegaly present.  Cardiovascular: Normal rate, regular rhythm and normal heart sounds.   Pulmonary/Chest: No accessory muscle usage. Tachypnea noted. No respiratory distress. He has decreased breath sounds in the right middle field and the right lower field. He has no wheezes. He has rhonchi in the right lower field. He exhibits no tenderness.  Abdominal: Soft. Bowel sounds are normal. He exhibits no distension. There is no tenderness.  Musculoskeletal: Normal range of motion.  Neurological: He is alert. He is disoriented. No cranial nerve deficit.  Pleasantly confused  Skin: Skin is warm and dry. No rash noted.  Psychiatric:  Pleasantly confused   LABORATORY PANEL:   CBC  Recent Labs Lab 06/28/15 0428  WBC 8.8  HGB 9.6*  HCT 29.2*  PLT 219   ------------------------------------------------------------------------------------------------------------------ Chemistries   Recent Labs Lab 06/26/15 1027  06/28/15 0428  NA 140  < > 139  K 3.7  < > 3.2*  CL 109  < > 109   CO2 24  < > 23  GLUCOSE 101*  < > 84  BUN 22*  < > 19  CREATININE 1.01  < > 0.84  CALCIUM 7.8*  < > 8.0*  AST 34  --   --   ALT 13*  --   --   ALKPHOS 50  --   --   BILITOT 0.6  --   --   < > = values in this interval not displayed. RADIOLOGY:  No results found. ASSESSMENT AND PLAN:  81 year old male with a history of dementia who presents from home with a cough and fever and clinical sepsis.  * Septic shock: likely due to aspiration pneumonia confirmed on CT scan.  Stop vancomycin & Zosyn - started unasyn on 11/25 and can switch over to oral Augmentin on discharge.  * Acute resp failure: Due to pneumonia. Improving.  Requiring no oxygen.   * Acute Metabolic encephalopathy: improving.  Could be due to sepsis.  We will stop Risperdal & remeron as son concerned about side effects and requested both to be discontinued   * Possible Urinary tract infection without hematuria: treated. continue unasyn. neg urine culture.  * Dementia with agitation: stop Risperdal & remeron per son's request.  Sitter at bedside   Son has unrealistic expectation and seems to be in denial. This patient has extremely poor prognosis and high risk for readmissions and likely demise. Seems very appropriate for Hospice but son doesn't want to hear about it yet.  All the records are reviewed and case discussed with Care Management/Social Worker. Management plans discussed  with the patient, family (SON) and they are in agreement.  CODE STATUS: DNR  TOTAL TIME TAKING CARE OF THIS PATIENT: 25 minutes.   High risk for cardio-pulmo arrest and possible death.  More than 50% of the time was spent in counseling/coordination of care: YES (d/w son Viviann SpareSteven @ [phone])  POSSIBLE D/C IN AM, DEPENDING ON CLINICAL CONDITION.     [facility], Emmalynn Pinkham M.D on 06/28/2015 at 7:30 AM  Between 7am to 6pm - Pager - [phone]  After 6pm go to [email or web address] - password [email or web address] [facility]  [facility]  Office   [phone]  CC: Primary care physician; No primary care provider on file.

## 2015-06-28 NOTE — Consult Note (Signed)
PULMONARY / CRITICAL CARE MEDICINE   Name: John Simon MRN: [id number] DOB: 01-22-1923    ADMISSION DATE:  06/25/2015 CONSULTATION DATE:  06/26/15  REFERRING MD :  Dr. Delfino Lovett   CHIEF COMPLAINT:    fever and cough, possible sepsis   HISTORY OF PRESENT ILLNESS - hx per chart review and family at bedside    81 y.o. male with a known history of dementia who presents with his son for above complaint. Patient son reports approximate 2 weeks ago he took his father home from an assisted living facility in which the patient was at due to agitation and wandering. Over the 2 weeks patient actually has been doing quite well. He was at [facility] only 2 days ago walking and feeling well. On 11/23 the son noticed that his father was very weak and also had a cough. He was concerned and therefore took his temperature which was read at 100. Due to weakness patient son called EMS and EMS brought the patient to the ER. In the emergency room his temp max was 103. He was treated initially with vancomycin and Zosyn for sepsis. His urinalysis does show UTI chest x-ray does not show evidence of pneumonia. Patient is agitated. Patient son also reports that over the past 2 weeks they have been trying to taper off Risperdal which was started a few weeks ago while the patient was at the assisted living facility. They feel that the Risperdal is causing the patient to be sedated and more agitated. On 11/24 patient was noted to be more somnolent, with lower BP, and concerns for becoming septic, he was transferred to the ICU for closer monitoring. His respiratory status worsened overnight, initially on 5-6L [place] then placed on NRB to maintain sats>88%. CT chest showed - bilateral basilar inflitrates, R>L - possible aspiration.   SUBJECTIVE Patient awake and alert this morning, very lucid, and interactive. States that he has a cough with thick white sputum, overall feeling better   SIGNIFICANT EVENTS   11/24>>possible asp  PNA, transferred to ICU  PAST MEDICAL HISTORY    :  Past Medical History  Diagnosis Date  . Anxiety   . Depression    History reviewed. No pertinent past surgical history. Prior to Admission medications   Medication Sig Start Date End Date Taking? Authorizing Provider  Melatonin 3 MG TABS Take 3 mg by mouth at bedtime.   Yes Historical Provider, MD  mirtazapine (REMERON) 15 MG tablet Take 15 mg by mouth at bedtime.   Yes Historical Provider, MD  polyethylene glycol (MIRALAX / GLYCOLAX) packet Take 17 g by mouth daily.   Yes Historical Provider, MD  risperiDONE (RISPERDAL) 1 MG tablet Take 0.5 mg by mouth at bedtime.   Yes Historical Provider, MD  Vitamin D, Ergocalciferol, (DRISDOL) 50000 UNITS CAPS capsule Take 50,000 Units by mouth once a week. On Tuesday night   Yes Historical Provider, MD   No Known Allergies   FAMILY HISTORY   History reviewed. No pertinent family history.    SOCIAL HISTORY    reports that he has never smoked. He does not have any smokeless tobacco history on file. He reports that he does not drink alcohol. His drug history is not on file.  Review of Systems  Constitutional: Negative for fever and chills.  HENT: Negative for hearing loss.   Eyes: Negative for double vision and pain.  Respiratory: Positive for cough and sputum production. Negative for hemoptysis, shortness of breath and wheezing.   Cardiovascular:  Negative for chest pain, palpitations and orthopnea.  Skin: Negative for itching and rash.  Neurological: Negative for headaches.      VITAL SIGNS    Temp:  [97.6 F (36.4 C)-98.7 F (37.1 C)] 98.7 F (37.1 C) (11/26 0755) Pulse Rate:  [80-94] 92 (11/26 0755) Resp:  [16-20] 18 (11/26 0755) BP: (116-143)/(53-94) 127/68 mmHg (11/26 0755) SpO2:  [92 %-100 %] 94 % (11/26 0755) HEMODYNAMICS:   VENTILATOR SETTINGS:   INTAKE / OUTPUT:  Intake/Output Summary (Last 24 hours) at 06/28/15 1120 Last data filed at 06/28/15 0932  Gross  per 24 hour  Intake 1569.25 ml  Output      0 ml  Net 1569.25 ml       PHYSICAL EXAM   Physical Exam  Constitutional: He appears well-developed.  HENT:  Head: Normocephalic and atraumatic.  Right Ear: External ear normal.  Left Ear: External ear normal.  Eyes: Conjunctivae are normal. Pupils are equal, round, and reactive to light.  Neck: Neck supple. No thyromegaly present.  Cardiovascular: Normal rate, regular rhythm and normal heart sounds.   Pulmonary/Chest: Effort normal.  Coarse upper airway sounds Cough No wheezes  Abdominal: Soft.  Neurological:  Alert this AM  Skin: Skin is warm and dry.       LABS   LABS:  CBC  Recent Labs Lab 06/26/15 1027 06/27/15 0605 06/28/15 0428  WBC 12.8* 11.8* 8.8  HGB 9.4* 10.5* 9.6*  HCT 29.2* 32.1* 29.2*  PLT 206 217 219   Coag's  Recent Labs Lab 06/26/15 1027  APTT 43*  INR 1.38   BMET  Recent Labs Lab 06/26/15 1027 06/27/15 0605 06/28/15 0428  NA 140 139 139  K 3.7 3.9 3.2*  CL 109 109 109  CO2 24 24 23   BUN 22* 20 19  CREATININE 1.01 0.90 0.84  GLUCOSE 101* 97 84   Electrolytes  Recent Labs Lab 06/26/15 1027 06/27/15 0605 06/28/15 0428  CALCIUM 7.8* 8.2* 8.0*   Sepsis Markers  Recent Labs Lab 06/25/15 1405 06/25/15 1926 06/26/15 1027 06/26/15 1033  LATICACIDVEN 1.2 0.9  --  1.1  PROCALCITON  --   --  4.64  --    ABG  Recent Labs Lab 06/26/15 0825  PHART 7.42  PCO2ART 37  PO2ART 57*   Liver Enzymes  Recent Labs Lab 06/25/15 1406 06/26/15 1027  AST 19 34  ALT 11* 13*  ALKPHOS 78 50  BILITOT 0.9 0.6  ALBUMIN 3.7 2.9*   Cardiac Enzymes  Recent Labs Lab 06/26/15 1027  TROPONINI 0.14*   Glucose  Recent Labs Lab 06/26/15 0902  GLUCAP 96     Recent Results (from the past 240 hour(s))  Culture, blood (routine x 2)     Status: None (Preliminary result)   Collection Time: 06/25/15  2:02 PM  Result Value Ref Range Status   Specimen Description BLOOD RIGHT  ASSIST CONTROL  Final   Special Requests BOTTLES DRAWN AEROBIC AND ANAEROBIC 5CC  Final   Culture NO GROWTH 3 DAYS  Final   Report Status PENDING  Incomplete  Urine culture     Status: None   Collection Time: 06/25/15  2:05 PM  Result Value Ref Range Status   Specimen Description URINE, RANDOM  Final   Special Requests NONE  Final   Culture NO GROWTH 2 DAYS  Final   Report Status 06/27/2015 FINAL  Final  Culture, blood (routine x 2)     Status: None (Preliminary result)   Collection Time:  06/25/15  2:06 PM  Result Value Ref Range Status   Specimen Description BLOOD LEFT WRIST  Final   Special Requests   Final    BOTTLES DRAWN AEROBIC AND ANAEROBIC  5CC ANAERO 6CC AERO   Culture NO GROWTH 3 DAYS  Final   Report Status PENDING  Incomplete  MRSA PCR Screening     Status: None   Collection Time: 06/26/15  9:35 AM  Result Value Ref Range Status   MRSA by PCR NEGATIVE NEGATIVE Final    Comment:        The GeneXpert MRSA Assay (FDA approved for NASAL specimens only), is one component of a comprehensive MRSA colonization surveillance program. It is not intended to diagnose MRSA infection nor to guide or monitor treatment for MRSA infections.   Culture, blood (routine x 2)     Status: None (Preliminary result)   Collection Time: 06/26/15 10:20 AM  Result Value Ref Range Status   Specimen Description BLOOD LEFT AC  Final   Special Requests   Final    BOTTLES DRAWN AEROBIC AND ANAEROBIC  AER 4CC ANA 1CC   Culture NO GROWTH 2 DAYS  Final   Report Status PENDING  Incomplete  Culture, blood (routine x 2)     Status: None (Preliminary result)   Collection Time: 06/26/15 10:33 AM  Result Value Ref Range Status   Specimen Description BLOOD RIGHT AC  Final   Special Requests   Final    BOTTLES DRAWN AEROBIC AND ANAEROBIC  AER 1CC ANA 3CC   Culture NO GROWTH 2 DAYS  Final   Report Status PENDING  Incomplete     Current facility-administered medications:  .  0.9 %  sodium chloride  infusion, , Intravenous, Continuous, Adrian SaranSital Mody, MD, Last Rate: 75 mL/hr at 06/28/15 0501 .  acetaminophen (TYLENOL) tablet 650 mg, 650 mg, Oral, Q6H PRN **OR** acetaminophen (TYLENOL) suppository 650 mg, 650 mg, Rectal, Q6H PRN, Adrian SaranSital Mody, MD, 650 mg at 06/26/15 0503 .  alum & mag hydroxide-simeth (MAALOX/MYLANTA) 200-200-20 MG/5ML suspension 30 mL, 30 mL, Oral, Q6H PRN, Adrian SaranSital Mody, MD .  Ampicillin-Sulbactam (UNASYN) 3 g in sodium chloride 0.9 % 100 mL IVPB, 3 g, Intravenous, Q6H, Bertram SavinMichael L Simpson, RPH, 3 g at 06/28/15 0500 .  enoxaparin (LOVENOX) injection 40 mg, 40 mg, Subcutaneous, Q24H, Sital Mody, MD, 40 mg at 06/27/15 2312 .  ondansetron (ZOFRAN) tablet 4 mg, 4 mg, Oral, Q6H PRN **OR** ondansetron (ZOFRAN) injection 4 mg, 4 mg, Intravenous, Q6H PRN, Sital Mody, MD .  polyethylene glycol (MIRALAX / GLYCOLAX) packet 17 g, 17 g, Oral, Daily, Adrian SaranSital Mody, MD, 17 g at 06/27/15 0915 .  senna-docusate (Senokot-S) tablet 1 tablet, 1 tablet, Oral, QHS PRN, Sital Mody, MD .  sodium chloride 0.9 % injection 3 mL, 3 mL, Intravenous, Q12H, Adrian SaranSital Mody, MD, 3 mL at 06/28/15 0916 .  [START ON 07/01/2015] Vitamin D (Ergocalciferol) (DRISDOL) capsule 50,000 Units, 50,000 Units, Oral, Weekly, Adrian SaranSital Mody, MD  IMAGING    Dg Chest 2 View  06/28/2015  CLINICAL DATA:  Shortness breath.  Cough and fever. EXAM: CHEST  2 VIEW COMPARISON:  06/26/2015 chest radiograph. FINDINGS: Stable cardiomediastinal silhouette with top-normal heart size. No pneumothorax. Trace right pleural effusion. No left pleural effusion. No pulmonary edema. Patchy consolidation in the right greater than left lower lobes, not appreciably changed. IMPRESSION: 1. Patchy consolidation in the right greater than left lower lobes, most suggestive of multifocal aspiration or multifocal pneumonia. 2. Trace right pleural effusion.  Electronically Signed   By: Delbert Phenix M.D.   On: 06/28/2015 09:35      Indwelling Urinary Catheter continued,  requirement due to   Reason to continue Indwelling Urinary Catheter for strict Intake/Output monitoring for hemodynamic instability   Central Line continued, requirement due to   Reason to continue [employer] Monitoring of central venous pressure or other hemodynamic parameters   Ventilator continued, requirement due to, resp failure    Ventilator Sedation RASS 0 to -2   Cultures: BCx2 11/24>> UC 11/24>> Sputum  Antibiotics: Zosyn 11/24>> Vanc 11/24>>  Lines:   ASSESSMENT/PLAN  89 with mild dementia, now with possible aspiration PNA, now with rapid clinical improvement.   PULMONARY A: Acute Resp Failure - improving Aspiration Pneumonia - improving Bilateral basilar PNA Sepsis - improving P:   - cont with antibiotics - f\u blood cultures -  maintain sats >88%, may use supplemental O2  - CT Chest - R>L basilar opacities - F\U cultures - cont with medical treatment, family stated that patient is a DNR - Overall patient is at high risk for aspiration pneumonitis/pneumonia, still with some thick gurgly upper airway sounds, but able to cough it up at this point. His dementia is mild, but his aspiration risk is high, he is cachectic and emaciated somewhat. Care teams have discussed with family possible hospice, which I am in agreement with, but son is not ready for this yet.  CARDIOVASCULAR -hemodynamic monitoring   RENAL A:   ?UTI P:   - cont with vanc\zosyn - monitor I\O - f\u UOP - f\u urine cx  GASTROINTESTINAL -PPI  HEMATOLOGIC - CBC    INFECTIOUS A:  Sepsis-improving Aspiration PNA\Basilar PNA P:   - cont with abx - f\u cultures  NEUROLOGIC A:  Mild dementia AMS - improved P:   RASS goal: 0 - Alert and awake this morning - stable to transfer to medical floor - limit benzos and antipsychotics   SOCIAL  - Family (son and daughter-in-law) updated on patient's current clinical status, they are not opposed to short-term intubation for 1-2  days, however after prolonged discussion and given patient's underlying comorbidities, they have decided on a full DO NOT RESUSCITATE, they wish to proceed with supportive medical care in the form of antibiotics, gentle IV fluids, pressors if needed.   Thank you for consulting [facility], we will signoff at this time.  Please feel free to contacts [place] with any questions.      Pulmonary Consult time devoted to patient care services described in this note is 35 minutes.   Stephanie Acre, MD [facility] Pager [phone] (please enter 7-digits) On Call Pager - [phone] (please enter 7-digits)   06/28/2015, 11:20 AM  Note: This note was prepared with Dragon dictation along with smaller phrase technology. Any transcriptional errors that result from this process are unintentional.

## 2015-06-29 LAB — BASIC METABOLIC PANEL
Anion gap: 7 (ref 5–15)
BUN: 16 mg/dL (ref 6–20)
CHLORIDE: 109 mmol/L (ref 101–111)
CO2: 26 mmol/L (ref 22–32)
CREATININE: 0.81 mg/dL (ref 0.61–1.24)
Calcium: 7.9 mg/dL — ABNORMAL LOW (ref 8.9–10.3)
GFR calc Af Amer: >60 mL/min (ref >60)
GFR calc non Af Amer: >60 mL/min (ref >60)
Glucose, Bld: 90 mg/dL (ref 65–99)
POTASSIUM: 3.2 mmol/L — AB (ref 3.5–5.1)
Sodium: 142 mmol/L (ref 135–145)

## 2015-06-29 LAB — CBC
HEMATOCRIT: 29.1 % — AB (ref 40.0–52.0)
HEMOGLOBIN: 9.7 g/dL — AB (ref 13.0–18.0)
MCH: 30.9 pg (ref 26.0–34.0)
MCHC: 33.4 g/dL (ref 32.0–36.0)
MCV: 92.5 fL (ref 80.0–100.0)
Platelets: 227 10*3/uL (ref 150–440)
RBC: 3.15 MIL/uL — AB (ref 4.40–5.90)
RDW: 13.5 % (ref 11.5–14.5)
WBC: 7.5 10*3/uL (ref 3.8–10.6)

## 2015-06-29 MED ORDER — QUETIAPINE FUMARATE 25 MG PO TABS: 25.0000 mg | ORAL_TABLET | Freq: Every day | ORAL | Status: DC

## 2015-06-29 MED ORDER — AMOXICILLIN-POT CLAVULANATE 875-125 MG PO TABS: 1.0000 | ORAL_TABLET | Freq: Two times a day (BID) | ORAL | Status: DC

## 2015-06-29 MED ORDER — POTASSIUM CHLORIDE CRYS ER 20 MEQ PO TBCR
40.0000 meq | EXTENDED_RELEASE_TABLET | Freq: Once | ORAL | Status: AC
  Administered 2015-06-29: 40 meq via ORAL
  Filled 2015-06-29: qty 2

## 2015-06-29 NOTE — Discharge Instructions (Signed)

## 2015-06-29 NOTE — Care Management Note (Signed)
Case Management Note  Patient Details  Name: John LeachLowell Simon MRN: [id number] Date of Birth: 10/09/1922  Subjective/Objective:  Referral faxed to [facility] for PT, RN, Aid, SW, ot, SLP per [name]'s son had previously chosen [facility] to be [name]'s home health provider. Will confirm address where [name] will be residing after hospital discharge today with son.                    Action/Plan:   Expected Discharge Date:                  Expected Discharge Plan:  Home w Home Health Services  In-House Referral:     Discharge planning Services  CM Consult  Post Acute Care Choice:  Home Health Choice offered to:  Adult Children  DME Arranged:    DME Agency:  [facility]  HH Arranged:  RN, PT, Nurse's Aide, Social Work [employer] Agency:     Status of Service:  In process, will continue to follow  Medicare Important Message Given:  Yes Date Medicare IM Given:    Medicare IM give by:    Date Additional Medicare IM Given:    Additional Medicare Important Message give by:     If discussed at Long Length of Stay Meetings, dates discussed:    Additional Comments:  Ilka Lovick A, RN 06/29/2015, 10:36 AM

## 2015-06-29 NOTE — Care Management Note (Signed)
Case Management Note  Patient Details  Name: John Simon MRN: [id number] Date of Birth: 07/22/1923  Subjective/Objective:     John Simon's son previously chose [facility] as John Simon's home health services provider. This Clinical research associatewriter requested that John Simon's nurse today, John SpanishAlana, John Simon, please call me when John Simon's son arrives so that we can confirm the address where John Simon will be residing and if there are transportation needs today.    Action/Plan:   Expected Discharge Date:                  Expected Discharge Plan:  Home w Home Health Services  In-House Referral:     Discharge planning Services  CM Consult  Post Acute Care Choice:  Home Health Choice offered to:  Adult Children  DME Arranged:    DME Agency:  [facility]  HH Arranged:  John Simon, PT, Nurse's Aide, Social Work [employer] Agency:     Status of Service:  In process, will continue to follow  Medicare Important Message Given:  Yes Date Medicare IM Given:    Medicare IM give by:    Date Additional Medicare IM Given:    Additional Medicare Important Message give by:     If discussed at Long Length of Stay Meetings, dates discussed:    Additional Comments:  Raydan Schlabach A, John Simon 06/29/2015, 8:23 AM

## 2015-06-29 NOTE — Discharge Summary (Addendum)
[facility] [place] [facility]   PATIENT NAME: John LeachLowell Simon    MR#:  [id number]  DATE OF BIRTH:  07/24/1923  DATE OF ADMISSION:  06/25/2015 ADMITTING PHYSICIAN: Adrian SaranSital Mody, MD  DATE OF DISCHARGE: 06/29/2015  PRIMARY CARE PHYSICIAN: Aletha HalimSparks, Jeff MD   ADMISSION DIAGNOSIS:  Cough [R05] Sepsis due to urinary tract infection (HCC) [A41.9, N39.0] Urinary tract infection without hematuria, site unspecified [N39.0]  DISCHARGE DIAGNOSIS:  Active Problems:   Sepsis (HCC)   Protein-calorie malnutrition, severe  septic shock due to aspiration pneumonia Acute hypoxic respiratory failure Acute metabolic encephalopathy UTI treated Dementia SECONDARY DIAGNOSIS:   Past Medical History  Diagnosis Date  . Anxiety   . Depression    HOSPITAL COURSE:  81 y.o. male with a known history of dementia admitted for sepsis.  Initially thought to be due to UTI based on UA, as chest x-ray did not show evidence of pneumonia. Please see Dr. Fredia BeetsModi's dictated history and physical for further details.  Subsequently, patient was found to have an acute hypoxic respiratory failure.  Repeat chest x-ray and CT of chest showed multifocal pneumonia, worrisome for aspiration.  He was started on broad-spectrum antibiotics.  He also required transfer to ICU for another day for close monitoring of sepsis. did not require any pressors, was transferred back to the ICU.  He has been slowly improving from his pneumonia has been afebrile.  No leukocytosis.  We will also found to have acute metabolic encephalopathy likely from his underlying septic shock due to aspiration pneumonia.  Patient's son was adamant in wanting to stop the Remeron and Risperdal was started by his primary care physician as son felt this made his overall mental condition worse.  These medications were stopped.  Patient seems very appropriate for hospice, but son is not quite ready and feels that he will continue to do well,  although he has agreed to consider hospice or palliative care in the future if patient continues to get worse.  At this time They are agreeable with the discharge plan and is discharged home in stable condition. DISCHARGE CONDITIONS:  Stable CONSULTS OBTAINED:    pulmonary critical care medicine - Stephanie AcreVishal Mungal, MD  DRUG ALLERGIES:  No Known Allergies  DISCHARGE MEDICATIONS:   Current Discharge Medication List    START taking these medications   Details  amoxicillin-clavulanate (AUGMENTIN) 875-125 MG tablet Take 1 tablet by mouth 2 (two) times daily. Qty: 14 tablet, Refills: 0    QUEtiapine (SEROQUEL) 25 MG tablet Take 1 tablet (25 mg total) by mouth at bedtime. Qty: 30 tablet, Refills: 0      CONTINUE these medications which have NOT CHANGED   Details  Melatonin 3 MG TABS Take 3 mg by mouth at bedtime.    polyethylene glycol (MIRALAX / GLYCOLAX) packet Take 17 g by mouth daily.    Vitamin D, Ergocalciferol, (DRISDOL) 50000 UNITS CAPS capsule Take 50,000 Units by mouth once a week. On Tuesday night      STOP taking these medications     mirtazapine (REMERON) 15 MG tablet      risperiDONE (RISPERDAL) 1 MG tablet        DISCHARGE INSTRUCTIONS:   DIET:  Regular diet  Dysphagia 3 (Mech soft);Thin liquid   Liquid Administration via: Cup;Straw Medication Administration: Whole meds with puree Supervision: Full supervision/cueing for compensatory strategies Compensations: Minimize environmental distractions;Slow rate;Small sips/bites Postural Changes: Seated upright at 90 degrees  Oral care BID;Staff/trained caregiver to provide oral care  DISCHARGE CONDITION:  Fair  ACTIVITY:  Activity as tolerated  OXYGEN:  Home Oxygen: No.   Oxygen Delivery: room air  DISCHARGE LOCATION:  home with Home health  If you experience worsening of your admission symptoms, develop shortness of breath, life threatening emergency, suicidal or homicidal  thoughts you must seek medical attention immediately by calling 911 or calling your MD immediately  if symptoms less severe.  You Must read complete instructions/literature along with all the possible adverse reactions/side effects for all the Medicines you take and that have been prescribed to you. Take any new Medicines after you have completely understood and accpet all the possible adverse reactions/side effects.   Please note  You were cared for by a hospitalist during your hospital stay. If you have any questions about your discharge medications or the care you received while you were in the hospital after you are discharged, you can call the unit and asked to speak with the hospitalist on call if the hospitalist that took care of you is not available. Once you are discharged, your primary care physician will handle any further medical issues. Please note that NO REFILLS for any discharge medications will be authorized once you are discharged, as it is imperative that you return to your primary care physician (or establish a relationship with a primary care physician if you do not have one) for your aftercare needs so that they can reassess your need for medications and monitor your lab values.    On the day of Discharge: VITAL SIGNS:  Blood pressure 165/71, pulse 80, temperature 97.8 F (36.6 C), temperature source Oral, resp. rate 18, height 6\' 3"  (1.905 m), weight 60.328 kg (133 lb), SpO2 94 %. PHYSICAL EXAMINATION:  GENERAL:  81 y.o.-year-old patient lying in the bed with no acute distress.  EYES: Pupils equal, round, reactive to light and accommodation. No scleral icterus. Extraocular muscles intact.  HEENT: Head atraumatic, normocephalic. Oropharynx and nasopharynx clear.  NECK:  Supple, no jugular venous distention. No thyroid enlargement, no tenderness.  LUNGS: Normal breath sounds bilaterally, no wheezing, rales,rhonchi or crepitation. No use of accessory muscles of respiration.   CARDIOVASCULAR: S1, S2 normal. No murmurs, rubs, or gallops.  ABDOMEN: Soft, non-tender, non-distended. Bowel sounds present. No organomegaly or mass.  EXTREMITIES: No pedal edema, cyanosis, or clubbing.  NEUROLOGIC: Cranial nerves II through XII are intact. Muscle strength 5/5 in all extremities. Sensation intact. Gait not checked.  PSYCHIATRIC: The patient is alert and oriented x 3.  SKIN: No obvious rash, lesion, or ulcer.   DATA REVIEW:   CBC  Recent Labs Lab 06/29/15 0513  WBC 7.5  HGB 9.7*  HCT 29.1*  PLT 227    Chemistries   Recent Labs Lab 06/26/15 1027  06/29/15 0513  NA 140  < > 142  K 3.7  < > 3.2*  CL 109  < > 109  CO2 24  < > 26  GLUCOSE 101*  < > 90  BUN 22*  < > 16  CREATININE 1.01  < > 0.81  CALCIUM 7.8*  < > 7.9*  AST 34  --   --   ALT 13*  --   --   ALKPHOS 50  --   --   BILITOT 0.6  --   --   < > = values in this interval not displayed.  Cardiac Enzymes  Recent Labs Lab 06/26/15 1027  TROPONINI 0.14*    Microbiology Results  Results for orders placed or performed  during the hospital encounter of 06/25/15  Culture, blood (routine x 2)     Status: None (Preliminary result)   Collection Time: 06/25/15  2:02 PM  Result Value Ref Range Status   Specimen Description BLOOD RIGHT ASSIST CONTROL  Final   Special Requests BOTTLES DRAWN AEROBIC AND ANAEROBIC 5CC  Final   Culture NO GROWTH 4 DAYS  Final   Report Status PENDING  Incomplete  Urine culture     Status: None   Collection Time: 06/25/15  2:05 PM  Result Value Ref Range Status   Specimen Description URINE, RANDOM  Final   Special Requests NONE  Final   Culture NO GROWTH 2 DAYS  Final   Report Status 06/27/2015 FINAL  Final  Culture, blood (routine x 2)     Status: None (Preliminary result)   Collection Time: 06/25/15  2:06 PM  Result Value Ref Range Status   Specimen Description BLOOD LEFT WRIST  Final   Special Requests   Final    BOTTLES DRAWN AEROBIC AND ANAEROBIC  5CC ANAERO  6CC AERO   Culture NO GROWTH 4 DAYS  Final   Report Status PENDING  Incomplete  MRSA PCR Screening     Status: None   Collection Time: 06/26/15  9:35 AM  Result Value Ref Range Status   MRSA by PCR NEGATIVE NEGATIVE Final    Comment:        The GeneXpert MRSA Assay (FDA approved for NASAL specimens only), is one component of a comprehensive MRSA colonization surveillance program. It is not intended to diagnose MRSA infection nor to guide or monitor treatment for MRSA infections.   Culture, blood (routine x 2)     Status: None (Preliminary result)   Collection Time: 06/26/15 10:20 AM  Result Value Ref Range Status   Specimen Description BLOOD LEFT AC  Final   Special Requests   Final    BOTTLES DRAWN AEROBIC AND ANAEROBIC  AER 4CC ANA 1CC   Culture NO GROWTH 3 DAYS  Final   Report Status PENDING  Incomplete  Culture, blood (routine x 2)     Status: None (Preliminary result)   Collection Time: 06/26/15 10:33 AM  Result Value Ref Range Status   Specimen Description BLOOD RIGHT AC  Final   Special Requests   Final    BOTTLES DRAWN AEROBIC AND ANAEROBIC  AER 1CC ANA 3CC   Culture NO GROWTH 3 DAYS  Final   Report Status PENDING  Incomplete    RADIOLOGY:  Dg Chest 2 View  06/28/2015  CLINICAL DATA:  Shortness breath.  Cough and fever. EXAM: CHEST  2 VIEW COMPARISON:  06/26/2015 chest radiograph. FINDINGS: Stable cardiomediastinal silhouette with top-normal heart size. No pneumothorax. Trace right pleural effusion. No left pleural effusion. No pulmonary edema. Patchy consolidation in the right greater than left lower lobes, not appreciably changed. IMPRESSION: 1. Patchy consolidation in the right greater than left lower lobes, most suggestive of multifocal aspiration or multifocal pneumonia. 2. Trace right pleural effusion. Electronically Signed   By: Delbert Phenix M.D.   On: 06/28/2015 09:35     Management plans discussed with the patient, family and they are in agreement.  He is  at very high risk for readmissions.  Consider palliative care and hospice if clinical condition worsens  CODE STATUS: DO NOT RESUSCITATE  TOTAL TIME TAKING CARE OF THIS PATIENT: 55 minutes.    [facility], Astin Rape M.D on 06/29/2015 at 10:41 AM  Between 7am to 6pm - Pager -  [phone]  After 6pm go to [email or web address] - password [email or web address] [facility]  [facility]  Office  [phone]  CC: Primary care physician; Aletha Halim MD

## 2015-06-30 LAB — CULTURE, BLOOD (ROUTINE X 2)
CULTURE: NO GROWTH
CULTURE: NO GROWTH

## 2015-07-02 LAB — CULTURE, BLOOD (ROUTINE X 2)
CULTURE: NO GROWTH
CULTURE: NO GROWTH

## 2016-04-26 ENCOUNTER — Inpatient Hospital Stay
Admission: EM | Admit: 2016-04-26 | Discharge: 2016-04-29 | DRG: 082 | Disposition: A | Discharge status: Nursing Home (Includes ICF) | Payer: Medicare Other | Attending: Internal Medicine | Admitting: Internal Medicine

## 2016-04-26 ENCOUNTER — Encounter: Payer: Self-pay | Admitting: Emergency Medicine

## 2016-04-26 ENCOUNTER — Emergency Department: Payer: Medicare Other

## 2016-04-26 DIAGNOSIS — R05 Cough: Secondary | ICD-10-CM

## 2016-04-26 DIAGNOSIS — I6200 Nontraumatic subdural hemorrhage, unspecified: Secondary | ICD-10-CM | POA: Diagnosis present

## 2016-04-26 DIAGNOSIS — W06XXXA Fall from bed, initial encounter: Secondary | ICD-10-CM | POA: Diagnosis present

## 2016-04-26 DIAGNOSIS — S0990XA Unspecified injury of head, initial encounter: Secondary | ICD-10-CM

## 2016-04-26 DIAGNOSIS — F329 Major depressive disorder, single episode, unspecified: Secondary | ICD-10-CM | POA: Diagnosis present

## 2016-04-26 DIAGNOSIS — Z66 Do not resuscitate: Secondary | ICD-10-CM | POA: Diagnosis present

## 2016-04-26 DIAGNOSIS — S065XAA Traumatic subdural hemorrhage with loss of consciousness status unknown, initial encounter: Secondary | ICD-10-CM

## 2016-04-26 DIAGNOSIS — Z9181 History of falling: Secondary | ICD-10-CM

## 2016-04-26 DIAGNOSIS — E43 Unspecified severe protein-calorie malnutrition: Secondary | ICD-10-CM | POA: Diagnosis present

## 2016-04-26 DIAGNOSIS — S065X9A Traumatic subdural hemorrhage with loss of consciousness of unspecified duration, initial encounter: Secondary | ICD-10-CM | POA: Diagnosis not present

## 2016-04-26 DIAGNOSIS — Z23 Encounter for immunization: Secondary | ICD-10-CM

## 2016-04-26 DIAGNOSIS — S0083XA Contusion of other part of head, initial encounter: Secondary | ICD-10-CM

## 2016-04-26 DIAGNOSIS — R2681 Unsteadiness on feet: Secondary | ICD-10-CM

## 2016-04-26 DIAGNOSIS — I872 Venous insufficiency (chronic) (peripheral): Secondary | ICD-10-CM | POA: Diagnosis present

## 2016-04-26 DIAGNOSIS — F419 Anxiety disorder, unspecified: Secondary | ICD-10-CM | POA: Diagnosis present

## 2016-04-26 DIAGNOSIS — J189 Pneumonia, unspecified organism: Secondary | ICD-10-CM | POA: Diagnosis present

## 2016-04-26 DIAGNOSIS — G309 Alzheimer's disease, unspecified: Secondary | ICD-10-CM | POA: Diagnosis present

## 2016-04-26 DIAGNOSIS — R296 Repeated falls: Secondary | ICD-10-CM

## 2016-04-26 DIAGNOSIS — F0280 Dementia in other diseases classified elsewhere without behavioral disturbance: Secondary | ICD-10-CM | POA: Diagnosis present

## 2016-04-26 DIAGNOSIS — Z6820 Body mass index (BMI) 20.0-20.9, adult: Secondary | ICD-10-CM

## 2016-04-26 DIAGNOSIS — R059 Cough, unspecified: Secondary | ICD-10-CM

## 2016-04-26 DIAGNOSIS — M6281 Muscle weakness (generalized): Secondary | ICD-10-CM

## 2016-04-26 DIAGNOSIS — W19XXXA Unspecified fall, initial encounter: Secondary | ICD-10-CM

## 2016-04-26 LAB — URINALYSIS COMPLETE WITH MICROSCOPIC (ARMC ONLY)
Bacteria, UA: NONE SEEN
Bilirubin Urine: NEGATIVE
Glucose, UA: NEGATIVE mg/dL
HGB URINE DIPSTICK: NEGATIVE
Ketones, ur: NEGATIVE mg/dL
Leukocytes, UA: NEGATIVE
Nitrite: NEGATIVE
PH: 5 (ref 5.0–8.0)
Protein, ur: NEGATIVE mg/dL
SQUAMOUS EPITHELIAL / LPF: NONE SEEN
Specific Gravity, Urine: 1.014 (ref 1.005–1.030)

## 2016-04-26 LAB — BASIC METABOLIC PANEL
Anion gap: 8 (ref 5–15)
BUN: 42 mg/dL — AB (ref 6–20)
CALCIUM: 8.4 mg/dL — AB (ref 8.9–10.3)
CHLORIDE: 109 mmol/L (ref 101–111)
CO2: 21 mmol/L — AB (ref 22–32)
CREATININE: 1.4 mg/dL — AB (ref 0.61–1.24)
GFR calc non Af Amer: 42 mL/min — ABNORMAL LOW (ref >60)
GFR, EST AFRICAN AMERICAN: 49 mL/min — AB (ref >60)
Glucose, Bld: 95 mg/dL (ref 65–99)
Potassium: 4.1 mmol/L (ref 3.5–5.1)
SODIUM: 138 mmol/L (ref 135–145)

## 2016-04-26 LAB — CBC
HCT: 27.9 % — ABNORMAL LOW (ref 40.0–52.0)
HEMOGLOBIN: 9.9 g/dL — AB (ref 13.0–18.0)
MCH: 32.5 pg (ref 26.0–34.0)
MCHC: 35.3 g/dL (ref 32.0–36.0)
MCV: 91.9 fL (ref 80.0–100.0)
Platelets: 290 10*3/uL (ref 150–440)
RBC: 3.03 MIL/uL — ABNORMAL LOW (ref 4.40–5.90)
RDW: 13.4 % (ref 11.5–14.5)
WBC: 10.2 10*3/uL (ref 3.8–10.6)

## 2016-04-26 LAB — MRSA PCR SCREENING: MRSA BY PCR: NEGATIVE

## 2016-04-26 LAB — TROPONIN I: TROPONIN I: 0.04 ng/mL — AB (ref <0.03)

## 2016-04-26 LAB — TSH: TSH: 2.459 u[IU]/mL (ref 0.350–4.500)

## 2016-04-26 MED ORDER — ONDANSETRON HCL 4 MG/2ML IJ SOLN: 4.0000 mg | Freq: Four times a day (QID) | INTRAMUSCULAR | Status: DC | PRN

## 2016-04-26 MED ORDER — MELATONIN 3 MG PO TABS: 3.0000 mg | ORAL_TABLET | Freq: Every day | ORAL | Status: DC

## 2016-04-26 MED ORDER — MEGESTROL ACETATE 400 MG/10ML PO SUSP
400.0000 mg | Freq: Every day | ORAL | Status: DC
  Filled 2016-04-26: qty 10

## 2016-04-26 MED ORDER — VITAMIN D 1000 UNITS PO TABS: 1000.0000 [IU] | ORAL_TABLET | Freq: Every day | ORAL | Status: DC

## 2016-04-26 MED ORDER — DOCUSATE SODIUM 100 MG PO CAPS: 100.0000 mg | ORAL_CAPSULE | Freq: Two times a day (BID) | ORAL | Status: DC

## 2016-04-26 MED ORDER — SODIUM CHLORIDE 0.9 % IV SOLN
INTRAVENOUS | Status: DC
  Administered 2016-04-26 – 2016-04-28 (×6): via INTRAVENOUS

## 2016-04-26 MED ORDER — FUROSEMIDE 20 MG PO TABS: 20.0000 mg | ORAL_TABLET | Freq: Every day | ORAL | Status: DC

## 2016-04-26 MED ORDER — ACETAMINOPHEN 325 MG PO TABS: 650.0000 mg | ORAL_TABLET | Freq: Four times a day (QID) | ORAL | Status: DC | PRN

## 2016-04-26 MED ORDER — SPIRITUS FRUMENTI: 1.0000 | Freq: Every day | ORAL | Status: DC

## 2016-04-26 MED ORDER — INFLUENZA VAC SPLIT QUAD 0.5 ML IM SUSY
0.5000 mL | PREFILLED_SYRINGE | INTRAMUSCULAR | Status: AC
  Administered 2016-04-28: 0.5 mL via INTRAMUSCULAR
  Filled 2016-04-26: qty 0.5

## 2016-04-26 MED ORDER — ACETAMINOPHEN 650 MG RE SUPP: 650.0000 mg | Freq: Four times a day (QID) | RECTAL | Status: DC | PRN

## 2016-04-26 MED ORDER — ONDANSETRON HCL 4 MG PO TABS: 4.0000 mg | ORAL_TABLET | Freq: Four times a day (QID) | ORAL | Status: DC | PRN

## 2016-04-26 NOTE — Progress Notes (Signed)
[employer] - [place] at [facility]   PATIENT NAME: John Simon    MR#:  [id number]  DATE OF BIRTH:  11/15/1922  SUBJECTIVE:  CHIEF COMPLAINT:   Chief Complaint  Patient presents with  . Fall   - unresponsive now, discussed with son over the phone. -Moaning when tried to move, echhymoses several areas, skin tear large on right knee .  REVIEW OF SYSTEMS:  Review of Systems  Unable to perform ROS: Mental acuity    DRUG ALLERGIES:  No Known Allergies  VITALS:  Blood pressure (!) 123/46, pulse 71, temperature 98 F (36.7 C), temperature source Oral, resp. rate 18, height 5\' 9"  (1.753 m), weight 58.5 kg (129 lb), SpO2 100 %.  PHYSICAL EXAMINATION:  Physical Exam  GENERAL:  82 y.o.-year-old elderly patient lying in the bed with no acute distress.  EYES: swollen right eye, steri strips under the eye, pupils 4 mm and sluggish reaction to light. - extraocular movements are intact HEENT: Head atraumatic, normocephalic. Oropharynx and nasopharynx clear. Dry mucous membranes NECK:  Supple, no jugular venous distention. No thyroid enlargement, no tenderness.  LUNGS: Normal breath sounds bilaterally, no wheezing, rales,rhonchi or crepitation. No use of accessory muscles of respiration. Decrease bibasilar breath sounds CARDIOVASCULAR: S1, S2 normal. No  rubs, or gallops. 3/6 systolic murmur is present ABDOMEN: Soft, nontender, nondistended. Bowel sounds present. No organomegaly or mass.  EXTREMITIES: No  cyanosis, or clubbing. 2+ pedal edema of both feet, worse on the right one. In tear on right knee NEUROLOGIC: Patient is moaning to deep stimulation, withdrawing his extremities at this time. PSYCHIATRIC: The patient is lethargic.  SKIN: Ecchymosis and skin tears noted  LABORATORY PANEL:   CBC  Recent Labs Lab 04/26/16 0404  WBC 10.2  HGB 9.9*  HCT 27.9*  PLT 290    ------------------------------------------------------------------------------------------------------------------  Chemistries   Recent Labs Lab 04/26/16 0404  NA 138  K 4.1  CL 109  CO2 21*  GLUCOSE 95  BUN 42*  CREATININE 1.40*  CALCIUM 8.4*   ------------------------------------------------------------------------------------------------------------------  Cardiac Enzymes  Recent Labs Lab 04/26/16 0404  TROPONINI 0.04*   ------------------------------------------------------------------------------------------------------------------  RADIOLOGY:  Ct Head Wo Contrast  Result Date: 04/26/2016 CLINICAL DATA:  Found face down on floor. Concern for head, maxillofacial or cervical spine injury. Initial encounter. EXAM: CT HEAD WITHOUT CONTRAST CT MAXILLOFACIAL WITHOUT CONTRAST CT CERVICAL SPINE WITHOUT CONTRAST TECHNIQUE: Multidetector CT imaging of the head, cervical spine, and maxillofacial structures were performed using the standard protocol without intravenous contrast. Multiplanar CT image reconstructions of the cervical spine and maxillofacial structures were also generated. COMPARISON:  None. FINDINGS: CT HEAD FINDINGS Brain: Trace acute subdural blood is noted along the right side of the tentorium cerebelli. Prominence of the ventricles and sulci reflects moderately severe cortical volume loss. Mild cerebellar atrophy is noted. Scattered periventricular white matter change likely reflects small vessel ischemic microangiopathy. The brainstem and fourth ventricle are within normal limits. The basal ganglia are unremarkable in appearance. The cerebral hemispheres demonstrate grossly normal gray-white differentiation. No mass effect or midline shift is seen. Vascular: No hyperdense vessel or unexpected calcification. Skull: There is no evidence of fracture; visualized osseous structures are unremarkable in appearance. Sinuses/Orbits: The visualized portions of the orbits are  within normal limits. There is complete opacification of the left maxillary sinus and partial opacification of the ethmoid air cells bilaterally, with mild mucosal thickening at the right side of the sphenoid sinus. There is opacification of the left mastoid air cells. The  remaining paranasal sinuses and right mastoid air cells are well-aerated. Other: Soft tissue swelling is noted overlying the right frontal calvarium. CT MAXILLOFACIAL FINDINGS Osseous: There is no evidence of fracture or dislocation. The maxilla and mandible appear intact. The nasal bone is unremarkable in appearance. The visualized dentition demonstrates no acute abnormality. Orbits: The orbits are intact bilaterally. Sinuses: There is complete opacification of the left maxillary sinus, and mild mucosal thickening at the right side of the sphenoid sinus. There is mild partial opacification of the ethmoid air cells, and opacification of the left mastoid air cells. The remaining visualized paranasal sinuses and right mastoid air cells are well-aerated. Soft tissues: Soft tissue swelling is noted overlying the right frontal calvarium. The parapharyngeal fat planes are preserved. The nasopharynx, oropharynx and hypopharynx are unremarkable in appearance. The visualized portions of the valleculae and piriform sinuses are grossly unremarkable. The parotid and submandibular glands are within normal limits. No cervical lymphadenopathy is seen. CT CERVICAL SPINE FINDINGS Alignment: Normal. Skull base and vertebrae: No acute fracture. No primary bone lesion or focal pathologic process. Soft tissues and spinal canal: No prevertebral fluid or swelling. No visible canal hematoma. Disc levels: Multilevel disc space narrowing is noted along the cervical and upper thoracic spine, with scattered anterior and posterior disc osteophyte complexes, and mild underlying facet disease. Upper chest: Small hypodensities within the thyroid gland are likely benign, given  their size. The visualized lung apices are grossly clear. Minimal calcification is seen at the carotid bifurcations bilaterally. Other: No additional soft tissue abnormalities are seen. IMPRESSION: 1. Trace acute subdural blood noted along the right side of the tentorium cerebelli. 2. No evidence of fracture or dislocation with regard to the maxillofacial structures. 3. No evidence of fracture or subluxation along the cervical spine. 4. Soft tissue swelling overlying the right frontal calvarium. 5. Moderately severe cortical volume loss and scattered small vessel ischemic microangiopathy. 6. Complete opacification of the left maxillary sinus and opacification of the left mastoid air cells, with mild mucosal thickening at the right side of the sphenoid sinus. 7. Mild diffuse degenerative change along the cervical and upper thoracic spine. 8. Minimal calcification at the carotid bifurcations bilaterally. Critical Value/emergent results were called by telephone at the time of interpretation on 04/26/2016 at 3:22 am to Dr. Lucrezia Europe, who verbally acknowledged these results. Electronically Signed   By: Roanna Raider M.D.   On: 04/26/2016 03:23   Ct Cervical Spine Wo Contrast  Result Date: 04/26/2016 CLINICAL DATA:  Found face down on floor. Concern for head, maxillofacial or cervical spine injury. Initial encounter. EXAM: CT HEAD WITHOUT CONTRAST CT MAXILLOFACIAL WITHOUT CONTRAST CT CERVICAL SPINE WITHOUT CONTRAST TECHNIQUE: Multidetector CT imaging of the head, cervical spine, and maxillofacial structures were performed using the standard protocol without intravenous contrast. Multiplanar CT image reconstructions of the cervical spine and maxillofacial structures were also generated. COMPARISON:  None. FINDINGS: CT HEAD FINDINGS Brain: Trace acute subdural blood is noted along the right side of the tentorium cerebelli. Prominence of the ventricles and sulci reflects moderately severe cortical volume loss. Mild  cerebellar atrophy is noted. Scattered periventricular white matter change likely reflects small vessel ischemic microangiopathy. The brainstem and fourth ventricle are within normal limits. The basal ganglia are unremarkable in appearance. The cerebral hemispheres demonstrate grossly normal gray-white differentiation. No mass effect or midline shift is seen. Vascular: No hyperdense vessel or unexpected calcification. Skull: There is no evidence of fracture; visualized osseous structures are unremarkable in appearance. Sinuses/Orbits: The visualized portions  of the orbits are within normal limits. There is complete opacification of the left maxillary sinus and partial opacification of the ethmoid air cells bilaterally, with mild mucosal thickening at the right side of the sphenoid sinus. There is opacification of the left mastoid air cells. The remaining paranasal sinuses and right mastoid air cells are well-aerated. Other: Soft tissue swelling is noted overlying the right frontal calvarium. CT MAXILLOFACIAL FINDINGS Osseous: There is no evidence of fracture or dislocation. The maxilla and mandible appear intact. The nasal bone is unremarkable in appearance. The visualized dentition demonstrates no acute abnormality. Orbits: The orbits are intact bilaterally. Sinuses: There is complete opacification of the left maxillary sinus, and mild mucosal thickening at the right side of the sphenoid sinus. There is mild partial opacification of the ethmoid air cells, and opacification of the left mastoid air cells. The remaining visualized paranasal sinuses and right mastoid air cells are well-aerated. Soft tissues: Soft tissue swelling is noted overlying the right frontal calvarium. The parapharyngeal fat planes are preserved. The nasopharynx, oropharynx and hypopharynx are unremarkable in appearance. The visualized portions of the valleculae and piriform sinuses are grossly unremarkable. The parotid and submandibular glands  are within normal limits. No cervical lymphadenopathy is seen. CT CERVICAL SPINE FINDINGS Alignment: Normal. Skull base and vertebrae: No acute fracture. No primary bone lesion or focal pathologic process. Soft tissues and spinal canal: No prevertebral fluid or swelling. No visible canal hematoma. Disc levels: Multilevel disc space narrowing is noted along the cervical and upper thoracic spine, with scattered anterior and posterior disc osteophyte complexes, and mild underlying facet disease. Upper chest: Small hypodensities within the thyroid gland are likely benign, given their size. The visualized lung apices are grossly clear. Minimal calcification is seen at the carotid bifurcations bilaterally. Other: No additional soft tissue abnormalities are seen. IMPRESSION: 1. Trace acute subdural blood noted along the right side of the tentorium cerebelli. 2. No evidence of fracture or dislocation with regard to the maxillofacial structures. 3. No evidence of fracture or subluxation along the cervical spine. 4. Soft tissue swelling overlying the right frontal calvarium. 5. Moderately severe cortical volume loss and scattered small vessel ischemic microangiopathy. 6. Complete opacification of the left maxillary sinus and opacification of the left mastoid air cells, with mild mucosal thickening at the right side of the sphenoid sinus. 7. Mild diffuse degenerative change along the cervical and upper thoracic spine. 8. Minimal calcification at the carotid bifurcations bilaterally. Critical Value/emergent results were called by telephone at the time of interpretation on 04/26/2016 at 3:22 am to Dr. Lucrezia Europe, who verbally acknowledged these results. Electronically Signed   By: Roanna Raider M.D.   On: 04/26/2016 03:23   Dg Shoulder Left  Result Date: 04/26/2016 CLINICAL DATA:  Found down at nursing home. Multiple recent falls. LEFT shoulder guarding. Dementia. EXAM: LEFT SHOULDER - 2+ VIEW COMPARISON:  None.  FINDINGS: There is no evidence of fracture or dislocation. There is no evidence of arthropathy or other focal bone abnormality. Soft tissues are unremarkable. IMPRESSION: Negative. Electronically Signed   By: Awilda Metro M.D.   On: 04/26/2016 02:35   Ct Maxillofacial Wo Contrast  Result Date: 04/26/2016 CLINICAL DATA:  Found face down on floor. Concern for head, maxillofacial or cervical spine injury. Initial encounter. EXAM: CT HEAD WITHOUT CONTRAST CT MAXILLOFACIAL WITHOUT CONTRAST CT CERVICAL SPINE WITHOUT CONTRAST TECHNIQUE: Multidetector CT imaging of the head, cervical spine, and maxillofacial structures were performed using the standard protocol without intravenous contrast. Multiplanar CT  image reconstructions of the cervical spine and maxillofacial structures were also generated. COMPARISON:  None. FINDINGS: CT HEAD FINDINGS Brain: Trace acute subdural blood is noted along the right side of the tentorium cerebelli. Prominence of the ventricles and sulci reflects moderately severe cortical volume loss. Mild cerebellar atrophy is noted. Scattered periventricular white matter change likely reflects small vessel ischemic microangiopathy. The brainstem and fourth ventricle are within normal limits. The basal ganglia are unremarkable in appearance. The cerebral hemispheres demonstrate grossly normal gray-white differentiation. No mass effect or midline shift is seen. Vascular: No hyperdense vessel or unexpected calcification. Skull: There is no evidence of fracture; visualized osseous structures are unremarkable in appearance. Sinuses/Orbits: The visualized portions of the orbits are within normal limits. There is complete opacification of the left maxillary sinus and partial opacification of the ethmoid air cells bilaterally, with mild mucosal thickening at the right side of the sphenoid sinus. There is opacification of the left mastoid air cells. The remaining paranasal sinuses and right mastoid air  cells are well-aerated. Other: Soft tissue swelling is noted overlying the right frontal calvarium. CT MAXILLOFACIAL FINDINGS Osseous: There is no evidence of fracture or dislocation. The maxilla and mandible appear intact. The nasal bone is unremarkable in appearance. The visualized dentition demonstrates no acute abnormality. Orbits: The orbits are intact bilaterally. Sinuses: There is complete opacification of the left maxillary sinus, and mild mucosal thickening at the right side of the sphenoid sinus. There is mild partial opacification of the ethmoid air cells, and opacification of the left mastoid air cells. The remaining visualized paranasal sinuses and right mastoid air cells are well-aerated. Soft tissues: Soft tissue swelling is noted overlying the right frontal calvarium. The parapharyngeal fat planes are preserved. The nasopharynx, oropharynx and hypopharynx are unremarkable in appearance. The visualized portions of the valleculae and piriform sinuses are grossly unremarkable. The parotid and submandibular glands are within normal limits. No cervical lymphadenopathy is seen. CT CERVICAL SPINE FINDINGS Alignment: Normal. Skull base and vertebrae: No acute fracture. No primary bone lesion or focal pathologic process. Soft tissues and spinal canal: No prevertebral fluid or swelling. No visible canal hematoma. Disc levels: Multilevel disc space narrowing is noted along the cervical and upper thoracic spine, with scattered anterior and posterior disc osteophyte complexes, and mild underlying facet disease. Upper chest: Small hypodensities within the thyroid gland are likely benign, given their size. The visualized lung apices are grossly clear. Minimal calcification is seen at the carotid bifurcations bilaterally. Other: No additional soft tissue abnormalities are seen. IMPRESSION: 1. Trace acute subdural blood noted along the right side of the tentorium cerebelli. 2. No evidence of fracture or dislocation  with regard to the maxillofacial structures. 3. No evidence of fracture or subluxation along the cervical spine. 4. Soft tissue swelling overlying the right frontal calvarium. 5. Moderately severe cortical volume loss and scattered small vessel ischemic microangiopathy. 6. Complete opacification of the left maxillary sinus and opacification of the left mastoid air cells, with mild mucosal thickening at the right side of the sphenoid sinus. 7. Mild diffuse degenerative change along the cervical and upper thoracic spine. 8. Minimal calcification at the carotid bifurcations bilaterally. Critical Value/emergent results were called by telephone at the time of interpretation on 04/26/2016 at 3:22 am to Dr. Lucrezia Europe, who verbally acknowledged these results. Electronically Signed   By: Roanna Raider M.D.   On: 04/26/2016 03:23    EKG:   Orders placed or performed during the hospital encounter of 04/26/16  . ED  EKG  . ED EKG  . EKG 12-Lead  . EKG 12-Lead    ASSESSMENT AND PLAN:   82 year old male with past medical history significant for malnutrition, anxiety and depression, dementia comes from assisted living facility after a fall.  #1 acute subdural hematoma-on the right side near the tentorium cerebella region -Due to his age and underlying dementia, [facility]. -Also small bleed, so 24-hour observation with neuro checks and repeat CT head tomorrow a.m. -Discussed with son and patient is a DO NOT RESUSCITATE -At this time lethargic, continue neuro checks  #2 lower extremity swelling-mostly in the feet, do teds at this time -On Lasix when necessary at home  #3 CK D-seems to be at baseline. Monitor  #4 dementia-severe Alzheimer's dementia at baseline. Patient able to recognize family, basic care for himself and able to ambulate slowly which shuffled gait but without any assistance at baseline. -Monitor at this time as not close to baseline  #5  protein calorie malnutrition-Megace is on hold as patient unable to take oral medications.  #6 DVT prophylaxis-Ted's and SCDs at this time.   Physical Therapy consult tomorrow prior to discharge   All the records are reviewed and case discussed with Care Management/Social Workerr. Management plans discussed with the patient, family and they are in agreement.  CODE STATUS: DNR  TOTAL TIME TAKING CARE OF THIS PATIENT: 45 minutes.   POSSIBLE D/C TOMORROW, DEPENDING ON CLINICAL CONDITION.   Enid BaasKALISETTI,Linnaea Ahn M.D on 04/26/2016 at 9:41 AM  Between 7am to 6pm - Pager - [phone]  After 6pm go to [email or web address] - password [email or web address]  [facility]  Office  [phone]  CC: Primary care physician; Marguarite ArbourSPARKS,JEFFREY D, MD

## 2016-04-26 NOTE — Clinical Social Work Note (Signed)
Clinical Social Work Assessment  Patient Details  Name: John Simon MRN: [id number] Date of Birth: 04-16-1923  Date of referral:  04/26/16               Reason for consult:  Discharge Planning                Permission sought to share information with:  Family Supports Permission granted to share information::     Name::        Agency::     Relationship::   (Jolie- Daughter-in-law)  Contact Information:     Housing/Transportation Living arrangements for the past 2 months:   ([facility]) Source of Information:  Patient Patient Interpreter Needed:  None Criminal Activity/Legal Involvement Pertinent to Current Situation/Hospitalization:  No - Comment as needed Significant Relationships:  Adult Children, Other Family Members Lives with:  Facility Resident [facility]) Do you feel safe going back to the place where you live?  Yes Need for family participation in patient care:  Yes (Comment) (Jolie& Remo Lipps- Daguter-in-law and Son)  Care giving concerns:  Patient is from [place]    Social Worker assessment / plan:  CSW was informed that patient is from [place]. CSW met with patient and his daughter-in-law Zachary George at bedside. Introduced herself and her role. Per Jolie patient is from [facility]. Stated he's been there for about 2-3 weeks. Stated they are privately paying for [facility]. Stated that they wanted to see what the tests ordered by MD yields to determine discharge plans. Stated that they are open to [place] if it's deemed appropriate for patient. CSW will continue to follow to determine discharge needs.   Employment status:  Retired Forensic scientist:  Medicare PT Recommendations:    Information / Referral to community resources:     Patient/Family's Response to care:  Jolie stated she's unsure of discharge plan. Would like to revisit discussion tomorrow 04/27/16  Patient/Family's Understanding of and Emotional Response to Diagnosis, Current  Treatment, and Prognosis:  Jolie reports that she is awaiting to see test results and then patient's treatment plan and prognosis will be clearer. Thanked CSW for assistance.   Emotional Assessment Appearance:  Appears stated age Attitude/Demeanor/Rapport:  Lethargic, Unresponsive Affect (typically observed):  Calm Orientation:   (Unable to assess) Alcohol / Substance use:  Not Applicable Psych involvement (Current and /or in the community):  No (Comment)  Discharge Needs  Concerns to be addressed:  Discharge Planning Concerns Readmission within the last 30 days:    Current discharge risk:  Chronically ill Barriers to Discharge:  Continued Medical Work up   [employer], LCSW 04/26/2016, 4:04 PM

## 2016-04-26 NOTE — ED Triage Notes (Signed)
Per [facility], pt was found face down on the floor during rounds. Pt is unable to answer questions due to advanced dementia. Pt's family reports that pt fell a couple of weeks ago and has large skin tear on right shin. Pt is in NAD at this time but has large skin tear underneath the right eye.

## 2016-04-26 NOTE — H&P (Signed)
John Simon is an 82 y.o. male.   Chief Complaint: Fall HPI: The patient with past medical history of dementia presents emergency department after being found face down on the floor at his nursing home. He has been falling recently and sustained a large skin tear a couple weeks ago. CT of his head in the emergency department revealed a small subdural hematoma. The patient voices no complaints. Following consultation with neurosurgery at referral centers it was decided the patient might best be served with observation for 24 hours and repeat of the CT scan to assess interval change.  Past Medical History:  Diagnosis Date  . Anxiety   . Depression     Past Surgical History:  Procedure Laterality Date  . SKIN BIOPSY      No family history on file. Patient cannot contribute to history Social History:  reports that he has never smoked. He has never used smokeless tobacco. He reports that he drinks about 4.2 oz of alcohol per week . He reports that he does not use drugs.  Allergies: No Known Allergies  Prior to Admission medications   Medication Sig Start Date End Date Taking? Authorizing Provider  cefUROXime (CEFTIN) 250 MG tablet Take 250 mg by mouth 2 (two) times daily with a meal. 04/23/16 05/02/16 Yes Historical Provider, MD  cholecalciferol (VITAMIN D) 1000 units tablet Take 1,000 Units by mouth daily.   Yes Historical Provider, MD  furosemide (LASIX) 20 MG tablet Take 20 mg by mouth daily.   Yes Historical Provider, MD  megestrol (MEGACE) 400 MG/10ML suspension Take 400 mg by mouth daily.   Yes Historical Provider, MD  Melatonin 5 MG TABS Take 5 mg by mouth at bedtime.    Yes Historical Provider, MD  spiritus frumenti (ETHYL ALCOHOL) SOLN Take 1 each by mouth at bedtime.   Yes Historical Provider, MD     Results for orders placed or performed during the hospital encounter of 04/26/16 (from the past 48 hour(s))  CBC     Status: Abnormal   Collection Time: 04/26/16  4:04 AM  Result  Value Ref Range   WBC 10.2 3.8 - 10.6 K/uL   RBC 3.03 (L) 4.40 - 5.90 MIL/uL   Hemoglobin 9.9 (L) 13.0 - 18.0 g/dL   HCT 27.9 (L) 40.0 - 52.0 %   MCV 91.9 80.0 - 100.0 fL   MCH 32.5 26.0 - 34.0 pg   MCHC 35.3 32.0 - 36.0 g/dL   RDW 13.4 11.5 - 14.5 %   Platelets 290 150 - 440 K/uL  Basic metabolic panel     Status: Abnormal   Collection Time: 04/26/16  4:04 AM  Result Value Ref Range   Sodium 138 135 - 145 mmol/L   Potassium 4.1 3.5 - 5.1 mmol/L   Chloride 109 101 - 111 mmol/L   CO2 21 (L) 22 - 32 mmol/L   Glucose, Bld 95 65 - 99 mg/dL   BUN 42 (H) 6 - 20 mg/dL   Creatinine, Ser 1.40 (H) 0.61 - 1.24 mg/dL   Calcium 8.4 (L) 8.9 - 10.3 mg/dL   GFR calc non Af Amer 42 (L) >60 mL/min   GFR calc Af Amer 49 (L) >60 mL/min    Comment: (NOTE) The eGFR has been calculated using the CKD EPI equation. This calculation has not been validated in all clinical situations. eGFR's persistently <60 mL/min signify possible Chronic Kidney Disease.    Anion gap 8 5 - 15  Troponin I  Status: Abnormal   Collection Time: 04/26/16  4:04 AM  Result Value Ref Range   Troponin I 0.04 (HH) <0.03 ng/mL    Comment: CRITICAL RESULT CALLED TO, READ BACK BY AND VERIFIED WITH REBECCA LYNN AT 0440 04/26/16.PMH  Urinalysis complete, with microscopic ([facility] only)     Status: Abnormal   Collection Time: 04/26/16  4:04 AM  Result Value Ref Range   Color, Urine YELLOW (A) YELLOW   APPearance CLEAR (A) CLEAR   Glucose, UA NEGATIVE NEGATIVE mg/dL   Bilirubin Urine NEGATIVE NEGATIVE   Ketones, ur NEGATIVE NEGATIVE mg/dL   Specific Gravity, Urine 1.014 1.005 - 1.030   Hgb urine dipstick NEGATIVE NEGATIVE   pH 5.0 5.0 - 8.0   Protein, ur NEGATIVE NEGATIVE mg/dL   Nitrite NEGATIVE NEGATIVE   Leukocytes, UA NEGATIVE NEGATIVE   RBC / HPF 0-5 0 - 5 RBC/hpf   WBC, UA 0-5 0 - 5 WBC/hpf   Bacteria, UA NONE SEEN NONE SEEN   Squamous Epithelial / LPF NONE SEEN NONE SEEN   Mucous PRESENT    Hyaline Casts, UA PRESENT     Ct Head Wo Contrast  Result Date: 04/26/2016 CLINICAL DATA:  Found face down on floor. Concern for head, maxillofacial or cervical spine injury. Initial encounter. EXAM: CT HEAD WITHOUT CONTRAST CT MAXILLOFACIAL WITHOUT CONTRAST CT CERVICAL SPINE WITHOUT CONTRAST TECHNIQUE: Multidetector CT imaging of the head, cervical spine, and maxillofacial structures were performed using the standard protocol without intravenous contrast. Multiplanar CT image reconstructions of the cervical spine and maxillofacial structures were also generated. COMPARISON:  None. FINDINGS: CT HEAD FINDINGS Brain: Trace acute subdural blood is noted along the right side of the tentorium cerebelli. Prominence of the ventricles and sulci reflects moderately severe cortical volume loss. Mild cerebellar atrophy is noted. Scattered periventricular white matter change likely reflects small vessel ischemic microangiopathy. The brainstem and fourth ventricle are within normal limits. The basal ganglia are unremarkable in appearance. The cerebral hemispheres demonstrate grossly normal gray-white differentiation. No mass effect or midline shift is seen. Vascular: No hyperdense vessel or unexpected calcification. Skull: There is no evidence of fracture; visualized osseous structures are unremarkable in appearance. Sinuses/Orbits: The visualized portions of the orbits are within normal limits. There is complete opacification of the left maxillary sinus and partial opacification of the ethmoid air cells bilaterally, with mild mucosal thickening at the right side of the sphenoid sinus. There is opacification of the left mastoid air cells. The remaining paranasal sinuses and right mastoid air cells are well-aerated. Other: Soft tissue swelling is noted overlying the right frontal calvarium. CT MAXILLOFACIAL FINDINGS Osseous: There is no evidence of fracture or dislocation. The maxilla and mandible appear intact. The nasal bone is unremarkable in  appearance. The visualized dentition demonstrates no acute abnormality. Orbits: The orbits are intact bilaterally. Sinuses: There is complete opacification of the left maxillary sinus, and mild mucosal thickening at the right side of the sphenoid sinus. There is mild partial opacification of the ethmoid air cells, and opacification of the left mastoid air cells. The remaining visualized paranasal sinuses and right mastoid air cells are well-aerated. Soft tissues: Soft tissue swelling is noted overlying the right frontal calvarium. The parapharyngeal fat planes are preserved. The nasopharynx, oropharynx and hypopharynx are unremarkable in appearance. The visualized portions of the valleculae and piriform sinuses are grossly unremarkable. The parotid and submandibular glands are within normal limits. No cervical lymphadenopathy is seen. CT CERVICAL SPINE FINDINGS Alignment: Normal. Skull base and vertebrae: No acute  fracture. No primary bone lesion or focal pathologic process. Soft tissues and spinal canal: No prevertebral fluid or swelling. No visible canal hematoma. Disc levels: Multilevel disc space narrowing is noted along the cervical and upper thoracic spine, with scattered anterior and posterior disc osteophyte complexes, and mild underlying facet disease. Upper chest: Small hypodensities within the thyroid gland are likely benign, given their size. The visualized lung apices are grossly clear. Minimal calcification is seen at the carotid bifurcations bilaterally. Other: No additional soft tissue abnormalities are seen. IMPRESSION: 1. Trace acute subdural blood noted along the right side of the tentorium cerebelli. 2. No evidence of fracture or dislocation with regard to the maxillofacial structures. 3. No evidence of fracture or subluxation along the cervical spine. 4. Soft tissue swelling overlying the right frontal calvarium. 5. Moderately severe cortical volume loss and scattered small vessel ischemic  microangiopathy. 6. Complete opacification of the left maxillary sinus and opacification of the left mastoid air cells, with mild mucosal thickening at the right side of the sphenoid sinus. 7. Mild diffuse degenerative change along the cervical and upper thoracic spine. 8. Minimal calcification at the carotid bifurcations bilaterally. Critical Value/emergent results were called by telephone at the time of interpretation on 04/26/2016 at 3:22 am to Dr. Charlesetta Ivory, who verbally acknowledged these results. Electronically Signed   By: Garald Balding M.D.   On: 04/26/2016 03:23   Ct Cervical Spine Wo Contrast  Result Date: 04/26/2016 CLINICAL DATA:  Found face down on floor. Concern for head, maxillofacial or cervical spine injury. Initial encounter. EXAM: CT HEAD WITHOUT CONTRAST CT MAXILLOFACIAL WITHOUT CONTRAST CT CERVICAL SPINE WITHOUT CONTRAST TECHNIQUE: Multidetector CT imaging of the head, cervical spine, and maxillofacial structures were performed using the standard protocol without intravenous contrast. Multiplanar CT image reconstructions of the cervical spine and maxillofacial structures were also generated. COMPARISON:  None. FINDINGS: CT HEAD FINDINGS Brain: Trace acute subdural blood is noted along the right side of the tentorium cerebelli. Prominence of the ventricles and sulci reflects moderately severe cortical volume loss. Mild cerebellar atrophy is noted. Scattered periventricular white matter change likely reflects small vessel ischemic microangiopathy. The brainstem and fourth ventricle are within normal limits. The basal ganglia are unremarkable in appearance. The cerebral hemispheres demonstrate grossly normal gray-white differentiation. No mass effect or midline shift is seen. Vascular: No hyperdense vessel or unexpected calcification. Skull: There is no evidence of fracture; visualized osseous structures are unremarkable in appearance. Sinuses/Orbits: The visualized portions of the orbits  are within normal limits. There is complete opacification of the left maxillary sinus and partial opacification of the ethmoid air cells bilaterally, with mild mucosal thickening at the right side of the sphenoid sinus. There is opacification of the left mastoid air cells. The remaining paranasal sinuses and right mastoid air cells are well-aerated. Other: Soft tissue swelling is noted overlying the right frontal calvarium. CT MAXILLOFACIAL FINDINGS Osseous: There is no evidence of fracture or dislocation. The maxilla and mandible appear intact. The nasal bone is unremarkable in appearance. The visualized dentition demonstrates no acute abnormality. Orbits: The orbits are intact bilaterally. Sinuses: There is complete opacification of the left maxillary sinus, and mild mucosal thickening at the right side of the sphenoid sinus. There is mild partial opacification of the ethmoid air cells, and opacification of the left mastoid air cells. The remaining visualized paranasal sinuses and right mastoid air cells are well-aerated. Soft tissues: Soft tissue swelling is noted overlying the right frontal calvarium. The parapharyngeal fat planes are preserved.  The nasopharynx, oropharynx and hypopharynx are unremarkable in appearance. The visualized portions of the valleculae and piriform sinuses are grossly unremarkable. The parotid and submandibular glands are within normal limits. No cervical lymphadenopathy is seen. CT CERVICAL SPINE FINDINGS Alignment: Normal. Skull base and vertebrae: No acute fracture. No primary bone lesion or focal pathologic process. Soft tissues and spinal canal: No prevertebral fluid or swelling. No visible canal hematoma. Disc levels: Multilevel disc space narrowing is noted along the cervical and upper thoracic spine, with scattered anterior and posterior disc osteophyte complexes, and mild underlying facet disease. Upper chest: Small hypodensities within the thyroid gland are likely benign,  given their size. The visualized lung apices are grossly clear. Minimal calcification is seen at the carotid bifurcations bilaterally. Other: No additional soft tissue abnormalities are seen. IMPRESSION: 1. Trace acute subdural blood noted along the right side of the tentorium cerebelli. 2. No evidence of fracture or dislocation with regard to the maxillofacial structures. 3. No evidence of fracture or subluxation along the cervical spine. 4. Soft tissue swelling overlying the right frontal calvarium. 5. Moderately severe cortical volume loss and scattered small vessel ischemic microangiopathy. 6. Complete opacification of the left maxillary sinus and opacification of the left mastoid air cells, with mild mucosal thickening at the right side of the sphenoid sinus. 7. Mild diffuse degenerative change along the cervical and upper thoracic spine. 8. Minimal calcification at the carotid bifurcations bilaterally. Critical Value/emergent results were called by telephone at the time of interpretation on 04/26/2016 at 3:22 am to Dr. Charlesetta Ivory, who verbally acknowledged these results. Electronically Signed   By: Garald Balding M.D.   On: 04/26/2016 03:23   Dg Shoulder Left  Result Date: 04/26/2016 CLINICAL DATA:  Found down at nursing home. Multiple recent falls. LEFT shoulder guarding. Dementia. EXAM: LEFT SHOULDER - 2+ VIEW COMPARISON:  None. FINDINGS: There is no evidence of fracture or dislocation. There is no evidence of arthropathy or other focal bone abnormality. Soft tissues are unremarkable. IMPRESSION: Negative. Electronically Signed   By: Elon Alas M.D.   On: 04/26/2016 02:35   Ct Maxillofacial Wo Contrast  Result Date: 04/26/2016 CLINICAL DATA:  Found face down on floor. Concern for head, maxillofacial or cervical spine injury. Initial encounter. EXAM: CT HEAD WITHOUT CONTRAST CT MAXILLOFACIAL WITHOUT CONTRAST CT CERVICAL SPINE WITHOUT CONTRAST TECHNIQUE: Multidetector CT imaging of the head,  cervical spine, and maxillofacial structures were performed using the standard protocol without intravenous contrast. Multiplanar CT image reconstructions of the cervical spine and maxillofacial structures were also generated. COMPARISON:  None. FINDINGS: CT HEAD FINDINGS Brain: Trace acute subdural blood is noted along the right side of the tentorium cerebelli. Prominence of the ventricles and sulci reflects moderately severe cortical volume loss. Mild cerebellar atrophy is noted. Scattered periventricular white matter change likely reflects small vessel ischemic microangiopathy. The brainstem and fourth ventricle are within normal limits. The basal ganglia are unremarkable in appearance. The cerebral hemispheres demonstrate grossly normal gray-white differentiation. No mass effect or midline shift is seen. Vascular: No hyperdense vessel or unexpected calcification. Skull: There is no evidence of fracture; visualized osseous structures are unremarkable in appearance. Sinuses/Orbits: The visualized portions of the orbits are within normal limits. There is complete opacification of the left maxillary sinus and partial opacification of the ethmoid air cells bilaterally, with mild mucosal thickening at the right side of the sphenoid sinus. There is opacification of the left mastoid air cells. The remaining paranasal sinuses and right mastoid air cells are well-aerated.  Other: Soft tissue swelling is noted overlying the right frontal calvarium. CT MAXILLOFACIAL FINDINGS Osseous: There is no evidence of fracture or dislocation. The maxilla and mandible appear intact. The nasal bone is unremarkable in appearance. The visualized dentition demonstrates no acute abnormality. Orbits: The orbits are intact bilaterally. Sinuses: There is complete opacification of the left maxillary sinus, and mild mucosal thickening at the right side of the sphenoid sinus. There is mild partial opacification of the ethmoid air cells, and  opacification of the left mastoid air cells. The remaining visualized paranasal sinuses and right mastoid air cells are well-aerated. Soft tissues: Soft tissue swelling is noted overlying the right frontal calvarium. The parapharyngeal fat planes are preserved. The nasopharynx, oropharynx and hypopharynx are unremarkable in appearance. The visualized portions of the valleculae and piriform sinuses are grossly unremarkable. The parotid and submandibular glands are within normal limits. No cervical lymphadenopathy is seen. CT CERVICAL SPINE FINDINGS Alignment: Normal. Skull base and vertebrae: No acute fracture. No primary bone lesion or focal pathologic process. Soft tissues and spinal canal: No prevertebral fluid or swelling. No visible canal hematoma. Disc levels: Multilevel disc space narrowing is noted along the cervical and upper thoracic spine, with scattered anterior and posterior disc osteophyte complexes, and mild underlying facet disease. Upper chest: Small hypodensities within the thyroid gland are likely benign, given their size. The visualized lung apices are grossly clear. Minimal calcification is seen at the carotid bifurcations bilaterally. Other: No additional soft tissue abnormalities are seen. IMPRESSION: 1. Trace acute subdural blood noted along the right side of the tentorium cerebelli. 2. No evidence of fracture or dislocation with regard to the maxillofacial structures. 3. No evidence of fracture or subluxation along the cervical spine. 4. Soft tissue swelling overlying the right frontal calvarium. 5. Moderately severe cortical volume loss and scattered small vessel ischemic microangiopathy. 6. Complete opacification of the left maxillary sinus and opacification of the left mastoid air cells, with mild mucosal thickening at the right side of the sphenoid sinus. 7. Mild diffuse degenerative change along the cervical and upper thoracic spine. 8. Minimal calcification at the carotid bifurcations  bilaterally. Critical Value/emergent results were called by telephone at the time of interpretation on 04/26/2016 at 3:22 am to Dr. Charlesetta Ivory, who verbally acknowledged these results. Electronically Signed   By: Garald Balding M.D.   On: 04/26/2016 03:23    Review of Systems  Unable to perform ROS: Dementia    Blood pressure (!) 150/74, pulse 72, temperature 98.8 F (37.1 C), temperature source Axillary, resp. rate 12, height 6' (1.829 m), weight 59 kg (130 lb), SpO2 98 %. Physical Exam  Constitutional: He appears well-developed and well-nourished.  HENT:  Head: Normocephalic.  Mouth/Throat: Oropharynx is clear and moist. No oropharyngeal exudate.  Large skin tear under right eye with associated orbital hematoma  Eyes: Conjunctivae and EOM are normal. Pupils are equal, round, and reactive to light. No scleral icterus.  Neck: Normal range of motion. Neck supple. No JVD present. No tracheal deviation present. No thyromegaly present.  Cardiovascular: Normal rate, regular rhythm and normal heart sounds.  Exam reveals no gallop and no friction rub.   No murmur heard. Respiratory: Effort normal and breath sounds normal. No respiratory distress.  GI: Soft. Bowel sounds are normal. He exhibits no distension. There is no tenderness.  Genitourinary:  Genitourinary Comments: Deferred  Musculoskeletal: Normal range of motion. He exhibits no edema.  Lymphadenopathy:    He has no cervical adenopathy.  Neurological: No cranial nerve  deficit.  somnolent  Skin: Skin is warm and dry. No erythema.  Psychiatric:  Asleep; dementia makes full psychological assessment difficult as the patient does not fully cooperate when awoken     Assessment/Plan This is a 82 year old male admitted for subdural hematoma. 1. Subdural hematoma: Hold anticoagulation for now. Repeat CT scan of the head in 24 hours. Baseline is dementia. 2. Chronic alcohol use: The patient drinks a beer every night before bed. 3.  Venous insufficiency: Continue Lasix per home regimen 4. DVT prophylaxis: SCDs 5. GI prophylaxis: None The patient is a DO NOT RESUSCITATE. Time spent on admission orders and patient care approximately 45 minutes  Harrie Foreman, MD 04/26/2016, 6:59 AM

## 2016-04-26 NOTE — ED Notes (Signed)
New wet guaze applied to skin tear of right lower lid, MD is aware, states she will attempt to apply steri strips to close the wound..Marland Kitchen

## 2016-04-26 NOTE — Progress Notes (Addendum)
Patient's daughter-in-law at bedside during lunch. Patient alert, ate 30% of meal total assist without difficulty sitting up in bed. Patient is still unable to follow directions or communicate clearly.

## 2016-04-26 NOTE — Progress Notes (Signed)
While rounding, I attempted an initial visit with Pt. Pt was asleep and pr. Nurse unresponsive. She was uncertain if family would be here later this afternoon. I provided a silent prayer and will follow up as needed.    04/26/16 1300  Clinical Encounter Type  Visited With Patient  Visit Type Initial;Spiritual support  Referral From Nurse  Spiritual Encounters  Spiritual Needs Prayer

## 2016-04-26 NOTE — Care Management Obs Status (Signed)
MEDICARE OBSERVATION STATUS NOTIFICATION   Patient Details  Name: John Simon MRN: [id number] Date of Birth: 06/01/1923   Medicare Observation Status Notification Given:  Yes    Gwenette GreetBrenda S Claribel Sachs, RN 04/26/2016, 2:31 PM

## 2016-04-26 NOTE — ED Provider Notes (Addendum)
[facility] Emergency Department Provider Note   ____________________________________________   First MD Initiated Contact with Patient 04/26/16 0141     (approximate)  I have reviewed the triage vital signs and the nursing notes.   HISTORY  Chief Complaint Fall  Patient with history of dementia and is unable to participate in history, history obtained from family member  HPI John LeachLowell Simon is a 82 y.o. male who comes into the hospital today with an unwitnessed fall at his nursing home. The patient rolled out of bed and the assumption is that he may have hit the nightstand on the way down. He does have some confusion at baseline and he is unsteady on his feet.The patient does not typically fall out of bed but he has had a similar fall in the past 6 months to a year. The patient is occasionally weak and he's had some swelling in his feet. He saw his primary care physician 3 days ago and they determined that the swelling was due to him walking more than normal. The patient does have a history of UTI which makes him more unsteady than normal. The family is concerned that he may have a UTI. He was also placed on some antibiotics for abrasions by Dr. Judithann SheenSparks 3 days ago. The patient is here for evaluation today.   Past Medical History:  Diagnosis Date  . Anxiety   . Depression     Patient Active Problem List   Diagnosis Date Noted  . Subdural bleeding (HCC) 04/26/2016  . Protein-calorie malnutrition, severe 06/27/2015  . Sepsis (HCC) 06/25/2015    Past Surgical History:  Procedure Laterality Date  . SKIN BIOPSY      Prior to Admission medications   Medication Sig Start Date End Date Taking? Authorizing Provider  cefUROXime (CEFTIN) 250 MG tablet Take 250 mg by mouth 2 (two) times daily with a meal. 04/23/16 05/02/16 Yes Historical Provider, MD  cholecalciferol (VITAMIN D) 1000 units tablet Take 1,000 Units by mouth daily.   Yes Historical Provider, MD    furosemide (LASIX) 20 MG tablet Take 20 mg by mouth daily.   Yes Historical Provider, MD  megestrol (MEGACE) 400 MG/10ML suspension Take 400 mg by mouth daily.   Yes Historical Provider, MD  Melatonin 5 MG TABS Take 5 mg by mouth at bedtime.    Yes Historical Provider, MD  spiritus frumenti (ETHYL ALCOHOL) SOLN Take 1 each by mouth at bedtime.   Yes Historical Provider, MD    Allergies Review of patient's allergies indicates no known allergies.  No family history on file.  Social History Social History  Substance Use Topics  . Smoking status: Never Smoker  . Smokeless tobacco: Never Used  . Alcohol use 4.2 oz/week    7 Cans of beer per week    Review of Systems Constitutional: No fever/chills Eyes: No visual changes. ENT: No sore throat. Cardiovascular: Denies chest pain. Respiratory: Denies shortness of breath. Gastrointestinal: No abdominal pain.  No nausea, no vomiting.  No diarrhea.  No constipation. Genitourinary: Negative for dysuria. Musculoskeletal: Negative for back pain. Skin: Intact as to right face with some contusion, skin tear to right leg Neurological: Negative for headaches, focal weakness or numbness.  10-point ROS otherwise negative.  ____________________________________________   PHYSICAL EXAM:  VITAL SIGNS: ED Triage Vitals  Enc Vitals Group     BP 04/26/16 0101 (!) 152/78     Pulse Rate 04/26/16 0101 81     Resp 04/26/16 0101 14  Temp 04/26/16 0101 98.8 F (37.1 C)     Temp Source 04/26/16 0101 Axillary     SpO2 04/26/16 0101 99 %     Weight 04/26/16 0110 130 lb (59 kg)     Height 04/26/16 0110 6' (1.829 m)     Head Circumference --      Peak Flow --      Pain Score --      Pain Loc --      Pain Edu? --      Excl. in GC? --     Constitutional: Sleeping but agitated when evaluated. Disoriented. Eyes: Conjunctivae are normal. PERRL. EOMI. Head: Contusion to right forehead Nose: No congestion/rhinnorhea. Mouth/Throat: Mucous  membranes are moist.  Oropharynx non-erythematous. Neck: No cervical spine tenderness to palpation. Cardiovascular: Normal rate, regular rhythm. Grossly normal heart sounds.  Good peripheral circulation. Respiratory: Normal respiratory effort.  No retractions. Lungs CTAB. Gastrointestinal: Soft and nontender. No distention. Positive bowel sounds Musculoskeletal: No lower extremity tenderness nor edema.   Neurologic:  Patient withdraws and localizes pain, patient does not follow commands well. Skin:  Skin tear below right eye, abrasions and contusions to right eye and right forehead. Psychiatric: Mood and affect are normal.   ____________________________________________   LABS (all labs ordered are listed, but only abnormal results are displayed)  Labs Reviewed  CBC - Abnormal; Notable for the following:       Result Value   RBC 3.03 (*)    Hemoglobin 9.9 (*)    HCT 27.9 (*)    All other components within normal limits  BASIC METABOLIC PANEL - Abnormal; Notable for the following:    CO2 21 (*)    BUN 42 (*)    Creatinine, Ser 1.40 (*)    Calcium 8.4 (*)    GFR calc non Af Amer 42 (*)    GFR calc Af Amer 49 (*)    All other components within normal limits  TROPONIN I - Abnormal; Notable for the following:    Troponin I 0.04 (*)    All other components within normal limits  URINALYSIS COMPLETEWITH MICROSCOPIC ([facility] ONLY) - Abnormal; Notable for the following:    Color, Urine YELLOW (*)    APPearance CLEAR (*)    All other components within normal limits   ____________________________________________  EKG  ED ECG REPORT I, Rebecka Apley, the attending physician, personally viewed and interpreted this ECG.   Date: 04/26/2016  EKG Time: 120  Rate: 77  Rhythm: normal sinus rhythm  Axis: normal  Intervals:right bundle branch block and left anterior fascicular block  ST&T Change: none  ____________________________________________  RADIOLOGY  CT head, cervical  spine, maxillofacial X-ray shoulder ____________________________________________   PROCEDURES  Procedure(s) performed: None  Procedures  Critical Care performed: Yes, see critical care note(s)   CRITICAL CARE Performed by: Lucrezia Europe P   Total critical care time: 30 minutes  Critical care time was exclusive of separately billable procedures and treating other patients.  Critical care was necessary to treat or prevent imminent or life-threatening deterioration.  Critical care was time spent personally by me on the following activities: development of treatment plan with patient and/or surrogate as well as nursing, discussions with consultants, evaluation of patient's response to treatment, examination of patient, obtaining history from patient or surrogate, ordering and performing treatments and interventions, ordering and review of laboratory studies, ordering and review of radiographic studies, pulse oximetry and re-evaluation of patient's condition.   ____________________________________________  INITIAL IMPRESSION / ASSESSMENT AND PLAN / ED COURSE  Pertinent labs & imaging results that were available during my care of the patient were reviewed by me and considered in my medical decision making (see chart for details).  This is a 82 year old male who comes into the hospital today with a roll out of bed. The patient has a contusion and a skin tear below his right eye. I am unable to suture the skin tear given the location and the quality of the injury. I did have the nurse place some wet gauze on it to help loosen it up so that I can place some Steri-Strips to hold it in place. I will check some blood work given the patient's unwitnessed fall and I will reassess the patient.  Clinical Course  Value Comment By Time  DG Shoulder Left Negative. Rebecka Apley, MD 09/25 0301  CT Head Wo Contrast 1. Trace acute subdural blood noted along the right side of the tentorium  cerebelli. 2. No evidence of fracture or dislocation with regard to the maxillofacial structures. 3. No evidence of fracture or subluxation along the cervical spine. 4. Soft tissue swelling overlying the right frontal calvarium. 5. Moderately severe cortical volume loss and scattered small vessel ischemic microangiopathy. 6. Complete opacification of the left maxillary sinus and opacification of the left mastoid air cells, with mild mucosal thickening at the right side of the sphenoid sinus. 7. Mild diffuse degenerative change along the cervical and upper thoracic spine. 8. Minimal calcification at the carotid bifurcations bilaterally. Critical Value/emergent results were called by telephone at the time of interpretation on 04/26/2016 at 3:22 am to Dr. Lucrezia Europe, who verbally acknowledged these results.   Rebecka Apley, MD 09/25 [phone]    The patient was found to have a subdural hematoma in his cerebellar lobe. The radiologist is not mentioned shift at this time. The plan was initially to transfer the patient to [facility] where he'll be monitored and observed and if he has no worsening symptoms he will be transferred back to his nursing home. I did contact [facility] instilled to the emergency room physician Dr. Cyril Mourning who was hesitant to take the patient. He reports that as the patient is 89 he is unlikely to need any acute surgical intervention. He would like for [place] to keep the patient here to monitor the patient and should the patient need any further service back and be explored later time. I contacted the hospitalist who was willing to accept the patient to their service. The patient had no worsening symptoms. He was still sleeping but was agitated when examined. The patient will be admitted to the hospitalist service. His no further injuries. I did place some Steri-Strips to the patient's skin tear under his eyelid. ____________________________________________   FINAL CLINICAL IMPRESSION(S) /  ED DIAGNOSES  Final diagnoses:  Facial contusion, initial encounter  Subdural hematoma (HCC)  Head injury, initial encounter  skin tears    NEW MEDICATIONS STARTED DURING THIS VISIT:  New Prescriptions   No medications on file     Note:  This document was prepared using Dragon voice recognition software and may include unintentional dictation errors.    Rebecka Apley, MD 04/26/16 [id number]    Rebecka Apley, MD 04/26/16 [phone]

## 2016-04-26 NOTE — ED Notes (Signed)
Spoke with the hospitalist about the pt not under DNR, the pt is a Full code, states after she has seen the pt this morning she will contact the family to speak with the further about the pt status..Marland Kitchen

## 2016-04-27 ENCOUNTER — Inpatient Hospital Stay: Payer: Medicare Other

## 2016-04-27 DIAGNOSIS — E43 Unspecified severe protein-calorie malnutrition: Secondary | ICD-10-CM | POA: Diagnosis present

## 2016-04-27 DIAGNOSIS — G309 Alzheimer's disease, unspecified: Secondary | ICD-10-CM | POA: Diagnosis present

## 2016-04-27 DIAGNOSIS — F419 Anxiety disorder, unspecified: Secondary | ICD-10-CM | POA: Diagnosis present

## 2016-04-27 DIAGNOSIS — S065X9A Traumatic subdural hemorrhage with loss of consciousness of unspecified duration, initial encounter: Secondary | ICD-10-CM | POA: Diagnosis present

## 2016-04-27 DIAGNOSIS — I872 Venous insufficiency (chronic) (peripheral): Secondary | ICD-10-CM | POA: Diagnosis present

## 2016-04-27 DIAGNOSIS — Z23 Encounter for immunization: Secondary | ICD-10-CM | POA: Diagnosis not present

## 2016-04-27 DIAGNOSIS — Z66 Do not resuscitate: Secondary | ICD-10-CM | POA: Diagnosis present

## 2016-04-27 DIAGNOSIS — F0280 Dementia in other diseases classified elsewhere without behavioral disturbance: Secondary | ICD-10-CM | POA: Diagnosis present

## 2016-04-27 DIAGNOSIS — W06XXXA Fall from bed, initial encounter: Secondary | ICD-10-CM | POA: Diagnosis present

## 2016-04-27 DIAGNOSIS — J189 Pneumonia, unspecified organism: Secondary | ICD-10-CM | POA: Diagnosis present

## 2016-04-27 DIAGNOSIS — I6200 Nontraumatic subdural hemorrhage, unspecified: Secondary | ICD-10-CM | POA: Diagnosis present

## 2016-04-27 DIAGNOSIS — Z6820 Body mass index (BMI) 20.0-20.9, adult: Secondary | ICD-10-CM | POA: Diagnosis not present

## 2016-04-27 DIAGNOSIS — F329 Major depressive disorder, single episode, unspecified: Secondary | ICD-10-CM | POA: Diagnosis present

## 2016-04-27 LAB — BASIC METABOLIC PANEL
ANION GAP: 5 (ref 5–15)
BUN: 26 mg/dL — AB (ref 6–20)
CHLORIDE: 112 mmol/L — AB (ref 101–111)
CO2: 22 mmol/L (ref 22–32)
Calcium: 8.2 mg/dL — ABNORMAL LOW (ref 8.9–10.3)
Creatinine, Ser: 0.93 mg/dL (ref 0.61–1.24)
GFR calc Af Amer: >60 mL/min (ref >60)
GFR calc non Af Amer: >60 mL/min (ref >60)
GLUCOSE: 82 mg/dL (ref 65–99)
POTASSIUM: 4.1 mmol/L (ref 3.5–5.1)
Sodium: 139 mmol/L (ref 135–145)

## 2016-04-27 LAB — HEMOGLOBIN A1C
Hgb A1c MFr Bld: 5.6 % (ref 4.8–5.6)
Mean Plasma Glucose: 114 mg/dL

## 2016-04-27 MED ORDER — MELATONIN 3 MG PO TABS
3.0000 mg | ORAL_TABLET | Freq: Every day | ORAL | Status: DC
  Filled 2016-04-27: qty 1

## 2016-04-27 MED ORDER — SPIRITUS FRUMENTI
1.0000 | Freq: Every day | ORAL | Status: DC
  Administered 2016-04-28: 1 via ORAL
  Filled 2016-04-27 (×4): qty 1

## 2016-04-27 MED ORDER — MELATONIN 5 MG PO TABS
5.0000 mg | ORAL_TABLET | Freq: Every day | ORAL | Status: DC
  Administered 2016-04-27 – 2016-04-28 (×2): 5 mg via ORAL
  Filled 2016-04-27 (×3): qty 1

## 2016-04-27 MED ORDER — LORAZEPAM 2 MG/ML IJ SOLN
0.5000 mg | Freq: Once | INTRAMUSCULAR | Status: AC
  Administered 2016-04-27: 0.5 mg via INTRAVENOUS
  Filled 2016-04-27: qty 1

## 2016-04-27 MED ORDER — LORAZEPAM 2 MG/ML IJ SOLN: 1.0000 mg | Freq: Once | INTRAMUSCULAR | Status: DC

## 2016-04-27 MED ORDER — MEGESTROL ACETATE 400 MG/10ML PO SUSP
400.0000 mg | Freq: Every day | ORAL | Status: DC
  Administered 2016-04-27 – 2016-04-29 (×3): 400 mg via ORAL
  Filled 2016-04-27 (×3): qty 10

## 2016-04-27 MED ORDER — ENSURE ENLIVE PO LIQD
237.0000 mL | Freq: Two times a day (BID) | ORAL | Status: DC
  Administered 2016-04-27 – 2016-04-29 (×5): 237 mL via ORAL

## 2016-04-27 NOTE — Progress Notes (Signed)
[employer] [facility] - [place] [facility]   PATIENT NAME: John LeachLowell Simon    MR#:  [id number]  DATE OF BIRTH:  06/15/1923  SUBJECTIVE:  CHIEF COMPLAINT:   Chief Complaint  Patient presents with  . Fall   -Patient with fall and subdural hematoma, admitted from assisted living facility. Patient was more alert this morning but very confused and trying to get out of bed. Received Ativan and sedated now  REVIEW OF SYSTEMS:  Review of Systems  Unable to perform ROS: Mental acuity    DRUG ALLERGIES:  No Known Allergies  VITALS:  Blood pressure 122/67, pulse 70, temperature 98.3 F (36.8 C), temperature source Oral, resp. rate 18, height 5\' 9"  (1.753 m), weight 59.1 kg (130 lb 3.2 oz), SpO2 99 %.  PHYSICAL EXAMINATION:  Physical Exam  GENERAL:  82 y.o.-year-old elderly patient lying in the bed with no acute distress.  EYES: swollen right eye, steri strips under the eye, pupils 4 mm and sluggish reaction to light. - extraocular movements are intact HEENT: Head atraumatic, normocephalic. Oropharynx and nasopharynx clear. Dry mucous membranes NECK:  Supple, no jugular venous distention. No thyroid enlargement, no tenderness.  LUNGS: Normal breath sounds bilaterally, no wheezing, rales,rhonchi or crepitation. No use of accessory muscles of respiration. Decrease bibasilar breath sounds CARDIOVASCULAR: S1, S2 normal. No  rubs, or gallops. 3/6 systolic murmur is present ABDOMEN: Soft, nontender, nondistended. Bowel sounds present. No organomegaly or mass.  EXTREMITIES: No  cyanosis, or clubbing. 2+ pedal edema of both feet, worse on the right one. In tear on right knee NEUROLOGIC: Patient is moaning to deep stimulation, withdrawing his extremities at this time. PSYCHIATRIC: The patient is lethargic/ sedated.  SKIN: Ecchymosis and skin tears noted  LABORATORY PANEL:   CBC  Recent Labs Lab 04/26/16 0404  WBC 10.2  HGB 9.9*  HCT 27.9*  PLT 290    ------------------------------------------------------------------------------------------------------------------  Chemistries   Recent Labs Lab 04/27/16 0427  NA 139  K 4.1  CL 112*  CO2 22  GLUCOSE 82  BUN 26*  CREATININE 0.93  CALCIUM 8.2*   ------------------------------------------------------------------------------------------------------------------  Cardiac Enzymes  Recent Labs Lab 04/26/16 0404  TROPONINI 0.04*   ------------------------------------------------------------------------------------------------------------------  RADIOLOGY:  Ct Head Wo Contrast  Result Date: 04/27/2016 CLINICAL DATA:  The subdural hematoma.  Follow-up. EXAM: CT HEAD WITHOUT CONTRAST TECHNIQUE: Contiguous axial images were obtained from the base of the skull through the vertex without intravenous contrast. COMPARISON:  04/26/2016 FINDINGS: Brain: There is small volume subdural hemorrhage along the right tentorium, with some changes in distribution but not significantly changed in overall amount. No new intracranial hemorrhage is identified. There is no evidence of acute infarct, mass, or midline shift. There is moderate cerebral atrophy and mild chronic small vessel ischemic change in the cerebral white matter. Vascular: No hyperdense vessel or unexpected calcification. Skull: No skull fracture or focal osseous lesion. Sinuses/Orbits: Unchanged complete left maxillary sinus opacification with mild osteitis suggestive of chronic sinusitis. Unchanged left greater than right ethmoid air cell opacification, right sphenoid hand right maxillary sinus mucosal thickening/ mucous retention cysts, and large left mastoid effusion. Prior bilateral cataract extraction. Other: Right supra orbital scalp hematoma, unchanged. IMPRESSION: 1. Persistent small volume subdural hemorrhage along the right tentorium. 2. No new intracranial abnormality. 3. Right supraorbital scalp hematoma. Electronically Signed    By: Sebastian AcheAllen  Grady M.D.   On: 04/27/2016 10:05   Ct Head Wo Contrast  Result Date: 04/26/2016 CLINICAL DATA:  Found face down on floor. Concern  for head, maxillofacial or cervical spine injury. Initial encounter. EXAM: CT HEAD WITHOUT CONTRAST CT MAXILLOFACIAL WITHOUT CONTRAST CT CERVICAL SPINE WITHOUT CONTRAST TECHNIQUE: Multidetector CT imaging of the head, cervical spine, and maxillofacial structures were performed using the standard protocol without intravenous contrast. Multiplanar CT image reconstructions of the cervical spine and maxillofacial structures were also generated. COMPARISON:  None. FINDINGS: CT HEAD FINDINGS Brain: Trace acute subdural blood is noted along the right side of the tentorium cerebelli. Prominence of the ventricles and sulci reflects moderately severe cortical volume loss. Mild cerebellar atrophy is noted. Scattered periventricular white matter change likely reflects small vessel ischemic microangiopathy. The brainstem and fourth ventricle are within normal limits. The basal ganglia are unremarkable in appearance. The cerebral hemispheres demonstrate grossly normal gray-white differentiation. No mass effect or midline shift is seen. Vascular: No hyperdense vessel or unexpected calcification. Skull: There is no evidence of fracture; visualized osseous structures are unremarkable in appearance. Sinuses/Orbits: The visualized portions of the orbits are within normal limits. There is complete opacification of the left maxillary sinus and partial opacification of the ethmoid air cells bilaterally, with mild mucosal thickening at the right side of the sphenoid sinus. There is opacification of the left mastoid air cells. The remaining paranasal sinuses and right mastoid air cells are well-aerated. Other: Soft tissue swelling is noted overlying the right frontal calvarium. CT MAXILLOFACIAL FINDINGS Osseous: There is no evidence of fracture or dislocation. The maxilla and mandible appear  intact. The nasal bone is unremarkable in appearance. The visualized dentition demonstrates no acute abnormality. Orbits: The orbits are intact bilaterally. Sinuses: There is complete opacification of the left maxillary sinus, and mild mucosal thickening at the right side of the sphenoid sinus. There is mild partial opacification of the ethmoid air cells, and opacification of the left mastoid air cells. The remaining visualized paranasal sinuses and right mastoid air cells are well-aerated. Soft tissues: Soft tissue swelling is noted overlying the right frontal calvarium. The parapharyngeal fat planes are preserved. The nasopharynx, oropharynx and hypopharynx are unremarkable in appearance. The visualized portions of the valleculae and piriform sinuses are grossly unremarkable. The parotid and submandibular glands are within normal limits. No cervical lymphadenopathy is seen. CT CERVICAL SPINE FINDINGS Alignment: Normal. Skull base and vertebrae: No acute fracture. No primary bone lesion or focal pathologic process. Soft tissues and spinal canal: No prevertebral fluid or swelling. No visible canal hematoma. Disc levels: Multilevel disc space narrowing is noted along the cervical and upper thoracic spine, with scattered anterior and posterior disc osteophyte complexes, and mild underlying facet disease. Upper chest: Small hypodensities within the thyroid gland are likely benign, given their size. The visualized lung apices are grossly clear. Minimal calcification is seen at the carotid bifurcations bilaterally. Other: No additional soft tissue abnormalities are seen. IMPRESSION: 1. Trace acute subdural blood noted along the right side of the tentorium cerebelli. 2. No evidence of fracture or dislocation with regard to the maxillofacial structures. 3. No evidence of fracture or subluxation along the cervical spine. 4. Soft tissue swelling overlying the right frontal calvarium. 5. Moderately severe cortical volume loss  and scattered small vessel ischemic microangiopathy. 6. Complete opacification of the left maxillary sinus and opacification of the left mastoid air cells, with mild mucosal thickening at the right side of the sphenoid sinus. 7. Mild diffuse degenerative change along the cervical and upper thoracic spine. 8. Minimal calcification at the carotid bifurcations bilaterally. Critical Value/emergent results were called by telephone at the time of interpretation on  04/26/2016 at 3:22 am to Dr. Lucrezia Europe, who verbally acknowledged these results. Electronically Signed   By: Roanna Raider M.D.   On: 04/26/2016 03:23   Ct Cervical Spine Wo Contrast  Result Date: 04/26/2016 CLINICAL DATA:  Found face down on floor. Concern for head, maxillofacial or cervical spine injury. Initial encounter. EXAM: CT HEAD WITHOUT CONTRAST CT MAXILLOFACIAL WITHOUT CONTRAST CT CERVICAL SPINE WITHOUT CONTRAST TECHNIQUE: Multidetector CT imaging of the head, cervical spine, and maxillofacial structures were performed using the standard protocol without intravenous contrast. Multiplanar CT image reconstructions of the cervical spine and maxillofacial structures were also generated. COMPARISON:  None. FINDINGS: CT HEAD FINDINGS Brain: Trace acute subdural blood is noted along the right side of the tentorium cerebelli. Prominence of the ventricles and sulci reflects moderately severe cortical volume loss. Mild cerebellar atrophy is noted. Scattered periventricular white matter change likely reflects small vessel ischemic microangiopathy. The brainstem and fourth ventricle are within normal limits. The basal ganglia are unremarkable in appearance. The cerebral hemispheres demonstrate grossly normal gray-white differentiation. No mass effect or midline shift is seen. Vascular: No hyperdense vessel or unexpected calcification. Skull: There is no evidence of fracture; visualized osseous structures are unremarkable in appearance. Sinuses/Orbits:  The visualized portions of the orbits are within normal limits. There is complete opacification of the left maxillary sinus and partial opacification of the ethmoid air cells bilaterally, with mild mucosal thickening at the right side of the sphenoid sinus. There is opacification of the left mastoid air cells. The remaining paranasal sinuses and right mastoid air cells are well-aerated. Other: Soft tissue swelling is noted overlying the right frontal calvarium. CT MAXILLOFACIAL FINDINGS Osseous: There is no evidence of fracture or dislocation. The maxilla and mandible appear intact. The nasal bone is unremarkable in appearance. The visualized dentition demonstrates no acute abnormality. Orbits: The orbits are intact bilaterally. Sinuses: There is complete opacification of the left maxillary sinus, and mild mucosal thickening at the right side of the sphenoid sinus. There is mild partial opacification of the ethmoid air cells, and opacification of the left mastoid air cells. The remaining visualized paranasal sinuses and right mastoid air cells are well-aerated. Soft tissues: Soft tissue swelling is noted overlying the right frontal calvarium. The parapharyngeal fat planes are preserved. The nasopharynx, oropharynx and hypopharynx are unremarkable in appearance. The visualized portions of the valleculae and piriform sinuses are grossly unremarkable. The parotid and submandibular glands are within normal limits. No cervical lymphadenopathy is seen. CT CERVICAL SPINE FINDINGS Alignment: Normal. Skull base and vertebrae: No acute fracture. No primary bone lesion or focal pathologic process. Soft tissues and spinal canal: No prevertebral fluid or swelling. No visible canal hematoma. Disc levels: Multilevel disc space narrowing is noted along the cervical and upper thoracic spine, with scattered anterior and posterior disc osteophyte complexes, and mild underlying facet disease. Upper chest: Small hypodensities within the  thyroid gland are likely benign, given their size. The visualized lung apices are grossly clear. Minimal calcification is seen at the carotid bifurcations bilaterally. Other: No additional soft tissue abnormalities are seen. IMPRESSION: 1. Trace acute subdural blood noted along the right side of the tentorium cerebelli. 2. No evidence of fracture or dislocation with regard to the maxillofacial structures. 3. No evidence of fracture or subluxation along the cervical spine. 4. Soft tissue swelling overlying the right frontal calvarium. 5. Moderately severe cortical volume loss and scattered small vessel ischemic microangiopathy. 6. Complete opacification of the left maxillary sinus and opacification of the left mastoid  air cells, with mild mucosal thickening at the right side of the sphenoid sinus. 7. Mild diffuse degenerative change along the cervical and upper thoracic spine. 8. Minimal calcification at the carotid bifurcations bilaterally. Critical Value/emergent results were called by telephone at the time of interpretation on 04/26/2016 at 3:22 am to Dr. Lucrezia Europe, who verbally acknowledged these results. Electronically Signed   By: Roanna Raider M.D.   On: 04/26/2016 03:23   Dg Shoulder Left  Result Date: 04/26/2016 CLINICAL DATA:  Found down at nursing home. Multiple recent falls. LEFT shoulder guarding. Dementia. EXAM: LEFT SHOULDER - 2+ VIEW COMPARISON:  None. FINDINGS: There is no evidence of fracture or dislocation. There is no evidence of arthropathy or other focal bone abnormality. Soft tissues are unremarkable. IMPRESSION: Negative. Electronically Signed   By: Awilda Metro M.D.   On: 04/26/2016 02:35   Ct Maxillofacial Wo Contrast  Result Date: 04/26/2016 CLINICAL DATA:  Found face down on floor. Concern for head, maxillofacial or cervical spine injury. Initial encounter. EXAM: CT HEAD WITHOUT CONTRAST CT MAXILLOFACIAL WITHOUT CONTRAST CT CERVICAL SPINE WITHOUT CONTRAST TECHNIQUE:  Multidetector CT imaging of the head, cervical spine, and maxillofacial structures were performed using the standard protocol without intravenous contrast. Multiplanar CT image reconstructions of the cervical spine and maxillofacial structures were also generated. COMPARISON:  None. FINDINGS: CT HEAD FINDINGS Brain: Trace acute subdural blood is noted along the right side of the tentorium cerebelli. Prominence of the ventricles and sulci reflects moderately severe cortical volume loss. Mild cerebellar atrophy is noted. Scattered periventricular white matter change likely reflects small vessel ischemic microangiopathy. The brainstem and fourth ventricle are within normal limits. The basal ganglia are unremarkable in appearance. The cerebral hemispheres demonstrate grossly normal gray-white differentiation. No mass effect or midline shift is seen. Vascular: No hyperdense vessel or unexpected calcification. Skull: There is no evidence of fracture; visualized osseous structures are unremarkable in appearance. Sinuses/Orbits: The visualized portions of the orbits are within normal limits. There is complete opacification of the left maxillary sinus and partial opacification of the ethmoid air cells bilaterally, with mild mucosal thickening at the right side of the sphenoid sinus. There is opacification of the left mastoid air cells. The remaining paranasal sinuses and right mastoid air cells are well-aerated. Other: Soft tissue swelling is noted overlying the right frontal calvarium. CT MAXILLOFACIAL FINDINGS Osseous: There is no evidence of fracture or dislocation. The maxilla and mandible appear intact. The nasal bone is unremarkable in appearance. The visualized dentition demonstrates no acute abnormality. Orbits: The orbits are intact bilaterally. Sinuses: There is complete opacification of the left maxillary sinus, and mild mucosal thickening at the right side of the sphenoid sinus. There is mild partial  opacification of the ethmoid air cells, and opacification of the left mastoid air cells. The remaining visualized paranasal sinuses and right mastoid air cells are well-aerated. Soft tissues: Soft tissue swelling is noted overlying the right frontal calvarium. The parapharyngeal fat planes are preserved. The nasopharynx, oropharynx and hypopharynx are unremarkable in appearance. The visualized portions of the valleculae and piriform sinuses are grossly unremarkable. The parotid and submandibular glands are within normal limits. No cervical lymphadenopathy is seen. CT CERVICAL SPINE FINDINGS Alignment: Normal. Skull base and vertebrae: No acute fracture. No primary bone lesion or focal pathologic process. Soft tissues and spinal canal: No prevertebral fluid or swelling. No visible canal hematoma. Disc levels: Multilevel disc space narrowing is noted along the cervical and upper thoracic spine, with scattered anterior and posterior disc  osteophyte complexes, and mild underlying facet disease. Upper chest: Small hypodensities within the thyroid gland are likely benign, given their size. The visualized lung apices are grossly clear. Minimal calcification is seen at the carotid bifurcations bilaterally. Other: No additional soft tissue abnormalities are seen. IMPRESSION: 1. Trace acute subdural blood noted along the right side of the tentorium cerebelli. 2. No evidence of fracture or dislocation with regard to the maxillofacial structures. 3. No evidence of fracture or subluxation along the cervical spine. 4. Soft tissue swelling overlying the right frontal calvarium. 5. Moderately severe cortical volume loss and scattered small vessel ischemic microangiopathy. 6. Complete opacification of the left maxillary sinus and opacification of the left mastoid air cells, with mild mucosal thickening at the right side of the sphenoid sinus. 7. Mild diffuse degenerative change along the cervical and upper thoracic spine. 8. Minimal  calcification at the carotid bifurcations bilaterally. Critical Value/emergent results were called by telephone at the time of interpretation on 04/26/2016 at 3:22 am to Dr. Lucrezia Europe, who verbally acknowledged these results. Electronically Signed   By: Roanna Raider M.D.   On: 04/26/2016 03:23    EKG:   Orders placed or performed during the hospital encounter of 04/26/16  . ED EKG  . ED EKG  . EKG 12-Lead  . EKG 12-Lead    ASSESSMENT AND PLAN:   82 year old male with past medical history significant for malnutrition, anxiety and depression, dementia comes from assisted living facility after a fall.  #1 acute subdural hematoma-on the right side near the tentorium cerebellar region -Repeat head CT after 24 hours this morning, showing no significant change in the subdural hematoma. -Due to his age and underlying dementia, [facility] and [facility] have refused transfer of the patient. -Continue to monitor mental status once patient is more awake. Avoid any blood thinners as high risk for falls  #2 lower extremity swelling-mostly in the feet, do teds at this time -On Lasix when necessary   #3 CK D-seems to be at baseline. Monitor  #4 dementia-severe Alzheimer's dementia at baseline. - Patient able to recognize family, basic care for himself and able to ambulate slowly which shuffled gait but without any assistance at baseline. -Worked with physical therapy today  #5 protein calorie malnutrition-Megace can be restarted  #6 DVT prophylaxis-Ted's and SCDs at this time.   Physical Therapy consult today Might need higher level of care. Will need palliative care follow-up either as inpatient or outpatient   All the records are reviewed and case discussed with Care Management/Social Workerr. Management plans discussed with the patient, family and they are in agreement.  CODE STATUS: DNR  TOTAL TIME TAKING CARE OF THIS PATIENT: 45 minutes.   POSSIBLE D/C TOMORROW, DEPENDING ON  CLINICAL CONDITION.   Enid Baas M.D on 04/27/2016 at 12:43 PM  Between 7am to 6pm - Pager - [phone]  After 6pm go to [email or web address] - password [email or web address]  [facility]  Office  [phone]  CC: Primary care physician; Marguarite Arbour, MD

## 2016-04-27 NOTE — Progress Notes (Addendum)
CSW spoke to patient's daughter and daughter-in-law. Discussed discharge plans. They are open to Palliative Care following patient at [facility]. [facility] [place] [name] Liaison. Contacted Investment banker, corporateBonnie- Administrator at [facility]. Stated that patient can return.   Spoke to family interested in sitters assisting with patient in facility. CSW spoke to [employer]- Administrator she reports that a sitter can sit with patient at facility as long as it is through a [employer] representativeprivate agency. Provided family with private agency list. CSW will continue to follow and assist.  Woodroe Modehristina Rupinder Livingston, MSW, LCSW, LCAS-A Clinical Social Worker [phone]

## 2016-04-27 NOTE — Progress Notes (Signed)
Dr. Nemiah CommanderKalisetti notified patient has been very confused/disoriented through the night and moving around in the bed- expressed concern for getting successful CT Head.   Dr. Nemiah CommanderKalisetti to review chart and place order for medication to calm patient before CT Head this morning.

## 2016-04-27 NOTE — Plan of Care (Signed)
Problem: Safety: Goal: Ability to remain free from injury will improve Outcome: Not Progressing Patient remains very confuse/disoriented. Noted for several attempts to get out of bed. Bed alarm on at all times. Continues to be a high risk for fall. Other fall precautionary measures in place.

## 2016-04-27 NOTE — Progress Notes (Signed)
Patient successfully completed CT Head. Ativan given as pre-medication, has been calm and sleeping since. Will try to feed once patient is more awake and alert.

## 2016-04-27 NOTE — Progress Notes (Signed)
PT Cancellation Note  Patient Details Name: John Simon MRN: [id number] DOB: 03/06/1923   Cancelled Treatment:    Reason Eval/Treat Not Completed: Other (comment). Eval re-attempted per MD. Pt currently lethargic/sedated secondary to meds given prior to imaging. Unable to arouse patient with verbal/tactile cues. Family in room reports pt usually ambulates independently with shuffling gait pattern. Does not use AD. Pt just recently moved to locked memory care unit with 24/7 staffing. Anticipate return to prior living environment if pt unable to participate in therapy. Will re-attempt evaluation next date.   Laelyn Blumenthal 04/27/2016, 2:49 PM  Elizabeth PalauStephanie Myrian Botello, PT, DPT [phone]

## 2016-04-27 NOTE — Progress Notes (Signed)
CH made follow up visit to Pt who was still unresponsive. Had silent prayer inside the room. Pr/Nurse family may be here this afternoon. Will round by and try to speak with them.    04/27/16 1200  Clinical Encounter Type  Visited With Patient  Visit Type Follow-up  Referral From Nurse  Spiritual Encounters  Spiritual Needs Prayer

## 2016-04-27 NOTE — Progress Notes (Signed)
PT Cancellation Note  Patient Details Name: John LeachLowell Simon MRN: [id number] DOB: 06/14/1923   Cancelled Treatment:     Attempted to evaluate pt this AM with pt off the floor for imaging.     Ovidio Hanger. Scott Imari Sivertsen PT, DPT 04/27/16, 12:23 PM

## 2016-04-27 NOTE — Progress Notes (Signed)
Dr. Nemiah CommanderKalisetti notified patient's son, Viviann SpareSteven, would like to be updated by phone [phone])

## 2016-04-27 NOTE — Progress Notes (Signed)
Initial Nutrition Assessment  DOCUMENTATION CODES:   Severe malnutrition in context of chronic illness  INTERVENTION:  -Ensure Enlive po BID, each supplement provides 350 kcal and 20 grams of protein  NUTRITION DIAGNOSIS:   Malnutrition related to chronic illness as evidenced by severe depletion of muscle mass, severe depletion of body fat.  GOAL:   Patient will meet greater than or equal to 90% of their needs  MONITOR:   PO intake, I & O's, Labs, Weight trends, Skin  REASON FOR ASSESSMENT:   Low Braden    ASSESSMENT:   The patient with past medical history of dementia presents emergency department after being found face down on the floor at his nursing home. He has been falling recently and sustained a large skin tear a couple weeks ago  Attempted to speak with Mr. Lafayette DragonCarr at bedside but unable to awaken. Per RN note appears he was given ativan prior to my visit. Pt also has a hx of dementia and confusion. Nutrition-Focused physical exam completed. Findings are sever fat depletion, severe muscle depletion, and no edema.  Wt history pt exhibits a 3#/2.3% insignificant wt loss over 10 months. Labs and medications reviewed: NS @ 11300mL/hr   Diet Order:  Diet regular Room service appropriate? Yes; Fluid consistency: Thin  Skin:  Reviewed, no issues  Last BM:  PTA  Height:   Ht Readings from Last 1 Encounters:  04/26/16 5\' 9"  (1.753 m)    Weight:   Wt Readings from Last 1 Encounters:  04/27/16 130 lb 3.2 oz (59.1 kg)    Ideal Body Weight:  72.72 kg  BMI:  Body mass index is 19.23 kg/m.  Estimated Nutritional Needs:   Kcal:  1500-1800 calories  Protein:  60-71 gm  Fluid:  >/= 1.5L  EDUCATION NEEDS:   No education needs identified at this time  Dionne AnoWilliam M. Helmuth Recupero, MS, RD LDN Inpatient Clinical Dietitian Pager [phone]

## 2016-04-28 LAB — BASIC METABOLIC PANEL
ANION GAP: 3 — AB (ref 5–15)
BUN: 19 mg/dL (ref 6–20)
CALCIUM: 8.1 mg/dL — AB (ref 8.9–10.3)
CO2: 22 mmol/L (ref 22–32)
CREATININE: 0.95 mg/dL (ref 0.61–1.24)
Chloride: 112 mmol/L — ABNORMAL HIGH (ref 101–111)
Glucose, Bld: 91 mg/dL (ref 65–99)
Potassium: 4 mmol/L (ref 3.5–5.1)
Sodium: 137 mmol/L (ref 135–145)

## 2016-04-28 LAB — CBC
HCT: 28.4 % — ABNORMAL LOW (ref 40.0–52.0)
Hemoglobin: 10 g/dL — ABNORMAL LOW (ref 13.0–18.0)
MCH: 32.2 pg (ref 26.0–34.0)
MCHC: 35.2 g/dL (ref 32.0–36.0)
MCV: 91.6 fL (ref 80.0–100.0)
PLATELETS: 279 10*3/uL (ref 150–440)
RBC: 3.1 MIL/uL — ABNORMAL LOW (ref 4.40–5.90)
RDW: 12.9 % (ref 11.5–14.5)
WBC: 11.2 10*3/uL — AB (ref 3.8–10.6)

## 2016-04-28 MED ORDER — LORAZEPAM 0.5 MG PO TABS
0.5000 mg | ORAL_TABLET | Freq: Four times a day (QID) | ORAL | Status: DC | PRN
Start: 2016-04-28 — End: 2016-04-29
  Administered 2016-04-29: 13:00:00 0.5 mg via ORAL
  Filled 2016-04-28: qty 1

## 2016-04-28 MED ORDER — OLANZAPINE 5 MG PO TBDP
2.5000 mg | ORAL_TABLET | Freq: Every day | ORAL | Status: DC
  Administered 2016-04-28: 21:00:00 2.5 mg via ORAL
  Filled 2016-04-28: qty 1

## 2016-04-28 MED ORDER — OLANZAPINE 5 MG PO TBDP: 2.5000 mg | ORAL_TABLET | Freq: Every day | ORAL | Status: DC

## 2016-04-28 NOTE — Plan of Care (Signed)
Problem: Education: Goal: Knowledge of [facility] Education information/materials will improve Outcome: Not Progressing Pt disoriented. H/o of dementia. Pt's daughter involved in the plan of care.  Problem: Safety: Goal: Ability to remain free from injury will improve Outcome: Progressing Low bed in use for safety.

## 2016-04-28 NOTE — Care Management Important Message (Signed)
Important Message  Patient Details  Name: Drake LeachLowell Glaeser MRN: [id number] Date of Birth: 02/12/1923   Medicare Important Message Given:  Yes    Gwenette GreetBrenda S Salil Raineri, RN 04/28/2016, 9:16 AM

## 2016-04-28 NOTE — Progress Notes (Signed)
[employer] [facility] [place] [facility]   PATIENT NAME: John Simon    MR#:  [id number]  DATE OF BIRTH:  [birth year]  SUBJECTIVE:  CHIEF COMPLAINT:   Chief Complaint  Patient presents with  . Fall   -Patient with fall and subdural hematoma, has severe dementia at baseline and also very confused today. - trying to pull things off him  REVIEW OF SYSTEMS:  Review of Systems  Unable to perform ROS: Mental acuity    DRUG ALLERGIES:  No Known Allergies  VITALS:  Blood pressure 139/75, pulse 80, temperature 98 F (36.7 C), temperature source Oral, resp. rate 11, height 5\' 9"  (1.753 m), weight 59.2 kg (130 lb 9.6 oz), SpO2 100 %.  PHYSICAL EXAMINATION:  Physical Exam  GENERAL:  82 y.o.-year-old elderly patient lying in the bed with no acute distress.  EYES: swollen right eye, steri strips under the eye, pupils 4 mm and sluggish reaction to light. - extraocular movements are intact HEENT: Head atraumatic, normocephalic. Oropharynx and nasopharynx clear. Dry mucous membranes NECK:  Supple, no jugular venous distention. No thyroid enlargement, no tenderness.  LUNGS: Normal breath sounds bilaterally, no wheezing, rales,rhonchi or crepitation. No use of accessory muscles of respiration. Decrease bibasilar breath sounds CARDIOVASCULAR: S1, S2 normal. No  rubs, or gallops. 3/6 systolic murmur is present ABDOMEN: Soft, nontender, nondistended. Bowel sounds present. No organomegaly or mass.  EXTREMITIES: No  cyanosis, or clubbing. 1+ pedal edema of both feet, skin tear on right knee NEUROLOGIC: Patient is alert today and moving all extremities in bed. Not following commands. PSYCHIATRIC: The patient is alert and trying to talk- laughing at times, very confused, not oriented at all SKIN: Ecchymosis and skin tears noted  LABORATORY PANEL:   CBC  Recent Labs Lab 04/28/16 0417  WBC 11.2*  HGB 10.0*  HCT 28.4*  PLT 279    ------------------------------------------------------------------------------------------------------------------  Chemistries   Recent Labs Lab 04/28/16 0417  NA 137  K 4.0  CL 112*  CO2 22  GLUCOSE 91  BUN 19  CREATININE 0.95  CALCIUM 8.1*   ------------------------------------------------------------------------------------------------------------------  Cardiac Enzymes  Recent Labs Lab 04/26/16 0404  TROPONINI 0.04*   ------------------------------------------------------------------------------------------------------------------  RADIOLOGY:  Ct Head Wo Contrast  Result Date: 04/27/2016 CLINICAL DATA:  The subdural hematoma.  Follow-up. EXAM: CT HEAD WITHOUT CONTRAST TECHNIQUE: Contiguous axial images were obtained from the base of the skull through the vertex without intravenous contrast. COMPARISON:  04/26/2016 FINDINGS: Brain: There is small volume subdural hemorrhage along the right tentorium, with some changes in distribution but not significantly changed in overall amount. No new intracranial hemorrhage is identified. There is no evidence of acute infarct, mass, or midline shift. There is moderate cerebral atrophy and mild chronic small vessel ischemic change in the cerebral white matter. Vascular: No hyperdense vessel or unexpected calcification. Skull: No skull fracture or focal osseous lesion. Sinuses/Orbits: Unchanged complete left maxillary sinus opacification with mild osteitis suggestive of chronic sinusitis. Unchanged left greater than right ethmoid air cell opacification, right sphenoid hand right maxillary sinus mucosal thickening/ mucous retention cysts, and large left mastoid effusion. Prior bilateral cataract extraction. Other: Right supra orbital scalp hematoma, unchanged. IMPRESSION: 1. Persistent small volume subdural hemorrhage along the right tentorium. 2. No new intracranial abnormality. 3. Right supraorbital scalp hematoma. Electronically Signed   By:  Sebastian Ache M.D.   On: 04/27/2016 10:05    EKG:   Orders placed or performed during the hospital encounter of 04/26/16  . ED EKG  .  ED EKG  . EKG 12-Lead  . EKG 12-Lead    ASSESSMENT AND PLAN:   82 year old male with past medical history significant for malnutrition, anxiety and depression, dementia comes from assisted living facility after a fall.  #1 acute subdural hematoma-on the right side near the tentorium cerebellar region -Repeat head CT after 24 hours this morning, showing no significant change in the subdural hematoma. Marland Kitchen. Avoid any blood thinners as high risk for falls - no further follow up  #2 lower extremity swelling-mostly in the feet, do teds at this time -On Lasix when necessary   #3 CK D-seems to be at baseline. Monitor  #4 dementia-severe Alzheimer's dementia at baseline. - Patient able to recognize family and able to ambulate slowly which shuffled gait at times at baseline. -but has significant behavioural issues from dementia- quite confused and pulling off stuff- significant side effects to lot of antipsychotics- including haldol, risperidone, geodon etc - will do ativan prn and zyprexa at bedtime  #5 protein calorie malnutrition-Megace  restarted  #6 DVT prophylaxis-Ted's and SCDs at this time.   Physical Therapy consulted- but patient unable to participate due to confusion. [facility] with hospice following. Discussed with daughter at bedside.   All the records are reviewed and case discussed with Care Management/Social Workerr. Management plans discussed with the patient, family and they are in agreement.  CODE STATUS: DNR  TOTAL TIME TAKING CARE OF THIS PATIENT: 39 minutes.   POSSIBLE D/C TOMORROW, DEPENDING ON CLINICAL CONDITION.   Enid BaasKALISETTI,Betsie Peckman M.D on 04/28/2016 at 1:54 PM  Between 7am to 6pm - Pager - [phone]  After 6pm go to [email or web address] - password [email or web address]  [facility]  Office   [phone]  CC: Primary care physician; Marguarite ArbourSPARKS,JEFFREY D, MD

## 2016-04-28 NOTE — Progress Notes (Signed)
PT Cancellation Note  Patient Details Name: Drake LeachLowell Dechellis MRN: [id number] DOB: 04/28/1923   Cancelled Treatment:    Reason Eval/Treat Not Completed: Other (comment). Evaluation re-attempted this date. Pt alert, however very confused, pulling up gown. Pt with garbled speech, unable to state name or follow verbal commands. Pt has movement in all 4 extremities, however spontaneously. Unable to participate in skilled physical therapy this date. Discussed with CM. Plan for pt to return back to ALF with private sitter and palliative care. Will dc orders at this time as pt unable to participate. Please re-order if needs change.   Oakleigh Hesketh 04/28/2016, 9:06 AM  Elizabeth PalauStephanie Roylee Chaffin, PT, DPT [phone]

## 2016-04-29 ENCOUNTER — Inpatient Hospital Stay: Payer: Medicare Other

## 2016-04-29 MED ORDER — LORAZEPAM 0.5 MG PO TABS: 0.5000 mg | ORAL_TABLET | Freq: Four times a day (QID) | ORAL | 0 refills | Status: DC | PRN

## 2016-04-29 MED ORDER — OLANZAPINE 5 MG PO TBDP: 5.0000 mg | ORAL_TABLET | Freq: Two times a day (BID) | ORAL | 0 refills | Status: AC | PRN

## 2016-04-29 MED ORDER — LEVOFLOXACIN 500 MG PO TABS: 500.0000 mg | ORAL_TABLET | Freq: Every day | ORAL | 0 refills | Status: AC

## 2016-04-29 MED ORDER — LORAZEPAM 0.5 MG PO TABS: 0.5000 mg | ORAL_TABLET | Freq: Four times a day (QID) | ORAL | 0 refills | Status: AC | PRN

## 2016-04-29 MED ORDER — HYDROCOD POLST-CPM POLST ER 10-8 MG/5ML PO SUER
5.0000 mL | Freq: Two times a day (BID) | ORAL | Status: DC | PRN
  Administered 2016-04-29: 5 mL via ORAL
  Filled 2016-04-29: qty 5

## 2016-04-29 NOTE — Progress Notes (Signed)
Clinical Social Worker was informed that patient will be medically ready to discharge to [facility]. Patient's daughter is in a agreement with plan. CSW called Kendal HymenBonnie- Production designer, theatre/television/filmAdministrator [facility] bed [facility].  All discharge information faxed to SNF via HUB. Rx's and DNR added to discharge packet. RN will call report and patient will discharge to [facility] via facility transport at 330 PM.  Woodroe Modehristina Salvador Coupe, MSW, LCSW, LCAS-A Clinical Social Worker [phone]

## 2016-04-29 NOTE — [place] FL2 (Addendum)
  [place] MEDICAID FL2 LEVEL OF CARE SCREENING TOOL     IDENTIFICATION  Patient Name: John LeachLowell Simon Birthdate: 12/19/1922 Sex: male Admission Date (Current Location): 04/26/2016  [place]   Facility and Address:  [facility], [address], [place], [place] [address]      Provider Number: [id number]  Attending Physician Name and Address:  Enid Baasadhika Kalisetti, MD  Relative Name and Phone Number:       Current Level of Care: Hospital Recommended Level of Care: Assisted Living Facility Prior Approval Number:    Date Approved/Denied:   PASRR Number:    Discharge Plan: Domiciliary (Rest home) (Memory Care)    Current Diagnoses: Patient Active Problem List   Diagnosis Date Noted  . Subdural bleeding (HCC) 04/26/2016  . Protein-calorie malnutrition, severe 06/27/2015  . Sepsis (HCC) 06/25/2015    Orientation RESPIRATION BLADDER Height & Weight        Normal Incontinent Weight: 138 lb (62.6 kg) Height:  5\' 9"  (175.3 cm)  BEHAVIORAL SYMPTOMS/MOOD NEUROLOGICAL BOWEL NUTRITION STATUS   (None)  (None) Incontinent Diet (Regular)  AMBULATORY STATUS COMMUNICATION OF NEEDS Skin   Extensive Assist Verbally Normal                       Personal Care Assistance Level of Assistance  Bathing, Feeding, Dressing Bathing Assistance: Limited assistance Feeding assistance: Limited assistance Dressing Assistance: Limited assistance     Functional Limitations Info  Sight, Hearing, Speech Sight Info: Adequate Hearing Info: Adequate Speech Info: Adequate    SPECIAL CARE FACTORS FREQUENCY                       Contractures      Additional Factors Info  Code Status, Allergies Code Status Info:  (DNR) Allergies Info:  (No Known Allergies)           DISCHARGE MEDICATIONS:     Medication List    STOP taking these medications   cefUROXime 250 MG tablet Commonly known as:  CEFTIN  furosemide 20 MG  tablet Commonly known as:  LASIX    TAKE these medications   cholecalciferol 1000 units tablet Commonly known as:  VITAMIN D Take 1,000 Units by mouth daily.  levofloxacin 500 MG tablet Commonly known as:  LEVAQUIN Take 1 tablet (500 mg total) by mouth daily. X 5 days  LORazepam 0.5 MG tablet Commonly known as:  ATIVAN Take 1 tablet (0.5 mg total) by mouth every 6 (six) hours as needed for anxiety (agitation).  megestrol 400 MG/10ML suspension Commonly known as:  MEGACE Take 400 mg by mouth daily.  Melatonin 5 MG Tabs Take 5 mg by mouth at bedtime.  OLANZapine zydis 5 MG disintegrating tablet Commonly known as:  ZYPREXA Take 1 tablet (5 mg total) by mouth 2 (two) times daily as needed (agitation, restlessness).  spiritus frumenti Soln Commonly known as:  ethyl alcohol Take 1 each by mouth at bedtime.         Relevant Imaging Results:  Relevant Lab Results:   Additional Information  (SSN [id number])  Verta Ellenhristina E Synia Douglass, LCSW

## 2016-04-29 NOTE — Progress Notes (Signed)
New referral for [name] at [facility] following discharge. [name] is a 82 year old man [facility] following and unwitnessed fall at his facility. Head CT in the ED revealed a small subdural hematoma, repeat CT on 9/26 showed no change. Per chart note review and discussion with [name]'s family he has had several falls over the past few months,. Along with increased behaviors/acting out. He has had several adverse reactions to medications used to "calm him" including Geodon, risperidone and haldol. He has received lorazepam during this admission with good effect. Appetite is very poor with little to no oral intake, he is also unable to follow commands or work with physical therapy. Family hs chosen to focus on [name]'s comfort and wishes to have [name]. Patient seen lying in bed, eyes closed, making some sounds, did not respond to verbal stimuli. He has not been able to work with Physical therapy and continues to be lethargic. Writer spoke via telephone to patient's daughter in law John Simon to initiate education regarding hospice services, philosophy and team approach to care with good understanding voiced. No equipment has been requested at this time. Plan is for discharge today via [facility] [name] with signed portable DNR in place. Thank you for the opportunity to be involved in the care of [name] and his family. Dayna BarkerKaren Robertson RN, BSN, [name], hospital Liaison [phone] c

## 2016-04-29 NOTE — Progress Notes (Signed)
Dr. Nemiah CommanderKalisetti notified patient has had persistent cough this morning since being fed breakfast sitting up without noted problem. Patient's cough not strong enough to cough anything up. Patient moaning at times seeming frustrated. Dr. Nemiah CommanderKalisetti to place order for chest xray and PRN cough medication- will call back MD with results.

## 2016-04-29 NOTE — Progress Notes (Signed)
Son, Brett CanalesSteve notified patient is [facility] the way [facility]. Son acknowledged and thankful for his father's care during this hospitalization.

## 2016-04-29 NOTE — Evaluation (Signed)
Physical Therapy Evaluation Patient Details Name: John Simon MRN: [id number] DOB: 1922/11/02 Today's Date: 04/29/2016   History of Present Illness  Pt admitted for subdural hematoma. Pt with history of dementia. and currently lives at [facility] in locked dementia unit. Pt cleared to participate from MD as hemmorrhage is stable. Reconsult noted this date secondary to family request.  Clinical Impression  Pt is a pleasant 82 year old male who was admitted for subdural hematoma. Pt performs bed mobility/transfers with mod assist +2 and HHA and unable to ambulate at this time. Re-consult requested this date as pt responds best to daughter in room secondary to dementia. Pt has difficulty following commands and unsure if able to participate in skilled PT at this time. Will attempt treatment on trial basis as pt is slightly removed from baseline level of functioning. Pt demonstrates deficits with balance impairment/mobility/falls/weakness. Would benefit from skilled PT to address above deficits and promote optimal return to PLOF. Plan is to dc to dementia unit this date with home hospice.      Follow Up Recommendations No PT follow up    Equipment Recommendations       Recommendations for Other Services       Precautions / Restrictions Precautions Precautions: Fall Restrictions Weight Bearing Restrictions: No      Mobility  Bed Mobility Overal bed mobility: Needs Assistance;+2 for physical assistance Bed Mobility: Supine to Sit     Supine to sit: Mod assist;+2 for physical assistance     General bed mobility comments: assist for bed mobility and rolling to R side. Pt then able to sit at EOB with heavy assist with post leaning noted. Pt then able to progress to sitting with cga.  Transfers Overall transfer level: Needs assistance Equipment used: 2 person hand held assist Transfers: Sit to/from Stand Sit to Stand: Mod assist;+2 safety/equipment;+2 physical assistance          General transfer comment: Able to stand with 2 person HHA x 4 reps. On first attempt, B knees buckling and falls back to bed. Pt able to improve technique with following attempts. Post leaning noted with B LE post support on bed during standing.   Ambulation/Gait             General Gait Details: unable to take steps secondary to difficulty following commands, attempted standing marching and side-stepping.   Stairs            Wheelchair Mobility    Modified Rankin (Stroke Patients Only)       Balance Overall balance assessment: History of Falls;Needs assistance Sitting-balance support: Feet supported;Bilateral upper extremity supported Sitting balance-Leahy Scale: Fair     Standing balance support: Bilateral upper extremity supported Standing balance-Leahy Scale: Poor                               Pertinent Vitals/Pain Pain Assessment: Faces Faces Pain Scale: Hurts a little bit Pain Location: B LE and back Pain Descriptors / Indicators:  (unable to describe) Pain Intervention(s): Limited activity within patient's tolerance;Repositioned    Home Living Family/patient expects to be discharged to::  (memory care with hospice)                      Prior Function Level of Independence: Needs assistance         Comments: daughter reports that patient is able to take steps, in a shuffled pattern, however has  history of multiple falls     Hand Dominance        Extremity/Trunk Assessment   Upper Extremity Assessment: Difficult to assess due to impaired cognition (grossly 3/5)           Lower Extremity Assessment: Difficult to assess due to impaired cognition (grossly 3/5)         Communication   Communication: Receptive difficulties;Expressive difficulties (garbled speech)  Cognition Arousal/Alertness:  (sleepy with eyes closed, however easy to arouse) Behavior During Therapy: Restless Overall Cognitive Status: History of  cognitive impairments - at baseline                      General Comments      Exercises Other Exercises Other Exercises: Multiple standing attempts tried with/without gait belt as pt keeps taking it off. Poor balance noted with heavy posterior leaning, Unable to follow complex commands, however responds best to daughter in room.   Assessment/Plan    PT Assessment Patient needs continued PT services  PT Problem List Decreased strength;Decreased activity tolerance;Decreased balance;Decreased mobility          PT Treatment Interventions Gait training;DME instruction;Therapeutic exercise    PT Goals (Current goals can be found in the Care Plan section)  Acute Rehab PT Goals Patient Stated Goal: pt unable to state goal PT Goal Formulation: Patient unable to participate in goal setting Time For Goal Achievement: 05/13/16 Potential to Achieve Goals: Poor    Frequency  (place on 3x trial to see if he can participate)   Barriers to discharge        Co-evaluation               End of Session Equipment Utilized During Treatment: Gait belt Activity Tolerance: Patient tolerated treatment well;Treatment limited secondary to medical complications (Comment) Patient left: in bed;with bed alarm set;with family/visitor present Nurse Communication: Mobility status         Time: [id number] PT Time Calculation (min) (ACUTE ONLY): 22 min   Charges:   PT Evaluation $PT Eval High Complexity: 1 Procedure PT Treatments $Therapeutic Activity: 8-22 mins   PT G Codes:        Marvie Calender 04/29/2016, 12:20 PM  Elizabeth PalauStephanie Vasily Fedewa, PT, DPT [phone]

## 2016-04-29 NOTE — Progress Notes (Signed)
[facility] [phone]). Daughter at bedside in agreement with discharge plan.  [facility] to transport patient back to facility. Discharge information sent electronically by CSW.

## 2016-04-29 NOTE — Discharge Summary (Addendum)
[facility] - [place] [facility]   PATIENT NAME: John Simon    MR#:  [id number]  DATE OF BIRTH:  04-Aug-1922  DATE OF ADMISSION:  04/26/2016   ADMITTING PHYSICIAN: Arnaldo Natal, MD  DATE OF DISCHARGE: 04/29/2016  PRIMARY CARE PHYSICIAN: SPARKS,JEFFREY D, MD   ADMISSION DIAGNOSIS:   Subdural hematoma (HCC) [I62.00] Fall [W19.XXXA] Head injury, initial encounter [S09.90XA] Facial contusion, initial encounter [S00.83XA]  DISCHARGE DIAGNOSIS:   Active Problems:   Subdural bleeding (HCC)   SECONDARY DIAGNOSIS:   Past Medical History:  Diagnosis Date  . Anxiety   . Depression     HOSPITAL COURSE:   82 year old male with past medical history significant for malnutrition, anxiety and depression, dementia comes from assisted living facility after a fall.  #1 acute subdural hematoma-on the right side near the tentorium cerebellar region -Repeat head CT after 24 hours - showing no significant change in the subdural hematoma. Marland Kitchen Avoid any blood thinners as high risk for falls - no further follow up needed  #2 lower extremity swelling-mostly in the feet, do teds at this time, resolved  #3 CKD-seems to be at baseline. Monitor  #4 dementia-severe Alzheimer's dementia at baseline. - Patient able to recognize family and able to ambulate slowly which shuffled gait at times at baseline. -but has significant behavioural issues from dementia- quite confused and pulling off stuff- significant side effects to lot of antipsychotics- including haldol, risperidone, geodon etc - continue ativan prn and zyprexa PRN  #5 protein calorie malnutrition-Megace  restarted  #6 Cough- CXR with pneumonia, started levaquin   Physical Therapy consulted- but patient unable to participate due to confusion. Plan to discharge to home place with hospice following.   DISCHARGE CONDITIONS:   Guarded  CONSULTS OBTAINED:   None  DRUG ALLERGIES:   No Known  Allergies DISCHARGE MEDICATIONS:     Medication List    STOP taking these medications   cefUROXime 250 MG tablet Commonly known as:  CEFTIN   furosemide 20 MG tablet Commonly known as:  LASIX     TAKE these medications   cholecalciferol 1000 units tablet Commonly known as:  VITAMIN D Take 1,000 Units by mouth daily.   levofloxacin 500 MG tablet Commonly known as:  LEVAQUIN Take 1 tablet (500 mg total) by mouth daily. X 5 days   LORazepam 0.5 MG tablet Commonly known as:  ATIVAN Take 1 tablet (0.5 mg total) by mouth every 6 (six) hours as needed for anxiety (agitation).   megestrol 400 MG/10ML suspension Commonly known as:  MEGACE Take 400 mg by mouth daily.   Melatonin 5 MG Tabs Take 5 mg by mouth at bedtime.   OLANZapine zydis 5 MG disintegrating tablet Commonly known as:  ZYPREXA Take 1 tablet (5 mg total) by mouth 2 (two) times daily as needed (agitation, restlessness).   spiritus frumenti Soln Commonly known as:  ethyl alcohol Take 1 each by mouth at bedtime.        DISCHARGE INSTRUCTIONS:   1. PCP f/u in 1-2 weeks 2. Hospice to follow at the assisted living  DIET:   Regular diet  ACTIVITY:   Activity as tolerated  OXYGEN:   Home Oxygen: No.  Oxygen Delivery: room air  DISCHARGE LOCATION:   Assisted Living- Home Place   If you experience worsening of your admission symptoms, develop shortness of breath, life threatening emergency, suicidal or homicidal thoughts you must seek medical attention immediately by calling 911 or calling your MD  immediately  if symptoms less severe.  You Must read complete instructions/literature along with all the possible adverse reactions/side effects for all the Medicines you take and that have been prescribed to you. Take any new Medicines after you have completely understood and accpet all the possible adverse reactions/side effects.   Please note  You were cared for by a hospitalist during your hospital  stay. If you have any questions about your discharge medications or the care you received while you were in the hospital after you are discharged, you can call the unit and asked to speak with the hospitalist on call if the hospitalist that took care of you is not available. Once you are discharged, your primary care physician will handle any further medical issues. Please note that NO REFILLS for any discharge medications will be authorized once you are discharged, as it is imperative that you return to your primary care physician (or establish a relationship with a primary care physician if you do not have one) for your aftercare needs so that they can reassess your need for medications and monitor your lab values.    On the day of Discharge:  VITAL SIGNS:   Blood pressure (!) 134/56, pulse 74, temperature 98.8 F (37.1 C), temperature source Axillary, resp. rate 11, height 5\' 9"  (1.753 m), weight 62.6 kg (138 lb), SpO2 97 %.  PHYSICAL EXAMINATION:   GENERAL:  82 y.o.-year-old elderly patient lying in the bed with no acute distress.  EYES: swollen right eye, steri strips under the eye, pupils 4 mm and sluggish reaction to light. - extraocular movements are intact HEENT: Head atraumatic, normocephalic. Oropharynx and nasopharynx clear. Dry mucous membranes NECK:  Supple, no jugular venous distention. No thyroid enlargement, no tenderness.  LUNGS: Normal breath sounds bilaterally, no wheezing, rales,rhonchi or crepitation. No use of accessory muscles of respiration. Decrease bibasilar breath sounds CARDIOVASCULAR: S1, S2 normal. No  rubs, or gallops. 3/6 systolic murmur is present ABDOMEN: Soft, nontender, nondistended. Bowel sounds present. No organomegaly or mass.  EXTREMITIES: No  cyanosis, or clubbing. 1+ pedal edema of both feet, skin tear on right knee NEUROLOGIC: Patient is alert today and moving all extremities in bed. Not following commands. PSYCHIATRIC: The patient is alert and trying  to talk- laughing at times, very confused, not oriented at all SKIN: Ecchymosis and skin tears noted  DATA REVIEW:   CBC  Recent Labs Lab 04/28/16 0417  WBC 11.2*  HGB 10.0*  HCT 28.4*  PLT 279    Chemistries   Recent Labs Lab 04/28/16 0417  NA 137  K 4.0  CL 112*  CO2 22  GLUCOSE 91  BUN 19  CREATININE 0.95  CALCIUM 8.1*     Microbiology Results  Results for orders placed or performed during the hospital encounter of 04/26/16  MRSA PCR Screening     Status: None   Collection Time: 04/26/16  9:00 AM  Result Value Ref Range Status   MRSA by PCR NEGATIVE NEGATIVE Final    Comment:        The GeneXpert MRSA Assay (FDA approved for NASAL specimens only), is one component of a comprehensive MRSA colonization surveillance program. It is not intended to diagnose MRSA infection nor to guide or monitor treatment for MRSA infections.     RADIOLOGY:  No results found.   Management plans discussed with the patient, family and they are in agreement.  CODE STATUS:     Code Status Orders  Start     Ordered   04/26/16 [phone]  Do not attempt resuscitation (DNR)  Continuous    Question Answer Comment  In the event of cardiac or respiratory ARREST Do not call a "code blue"   In the event of cardiac or respiratory ARREST Do not perform Intubation, CPR, defibrillation or ACLS   In the event of cardiac or respiratory ARREST Use medication by any route, position, wound care, and other measures to relive pain and suffering. May use oxygen, suction and manual treatment of airway obstruction as needed for comfort.      04/26/16 0851    Code Status History    Date Active Date Inactive Code Status Order ID Comments User Context   06/26/2015  7:38 AM 06/29/2015  3:24 PM DNR [id number]  Delfino LovettVipul Shah, MD Inpatient   06/25/2015  6:34 PM 06/26/2015  7:38 AM Full Code [id number]  Adrian SaranSital Mody, MD Inpatient    Advance Directive Documentation   Flowsheet Row Most Recent Value   Type of Advance Directive  Healthcare Power of [facility] [Mary Dorthey SawyerAnn Von Hausen (daughter is POA)]  Pre-existing out of facility DNR order (yellow form or pink MOST form)  No data  "MOST" Form in Place?  No data      TOTAL TIME TAKING CARE OF THIS PATIENT: 37 minutes.    Enid BaasKALISETTI,Khris Jansson M.D on 04/29/2016 at 9:35 AM  Between 7am to 6pm - Pager - [phone]  After 6pm go to [email or web address] - Social research officer, [email or web address] [email or web address]  [facility] [place] [facility]  Office  [phone]  CC: Primary care physician; Marguarite ArbourSPARKS,JEFFREY D, MD   Note: This dictation was prepared with Dragon dictation along with smaller phrase technology. Any transcriptional errors that result from this process are unintentional.

## 2016-04-29 NOTE — Progress Notes (Signed)
[facility]. Patient safely transferred from bed to wheelchair.

## 2016-06-02 DEATH — deceased

## 2018-03-29 IMAGING — CT CT HEAD W/O CM
3 series · 15 of 47 positions shown, 18 images · non-contrast
Comparison: 04/26/2016

CLINICAL DATA: The subdural hematoma.  Follow-up.

EXAM:
CT HEAD WITHOUT CONTRAST
TECHNIQUE: Contiguous axial images were obtained from the base of the skull
through the vertex without intravenous contrast.

[Series 2: head wo · axial · 0.42mm/px · z∈[+262,+392]mm · 9 of 32 slices shown, 12 images]
[im 3/32  brain]
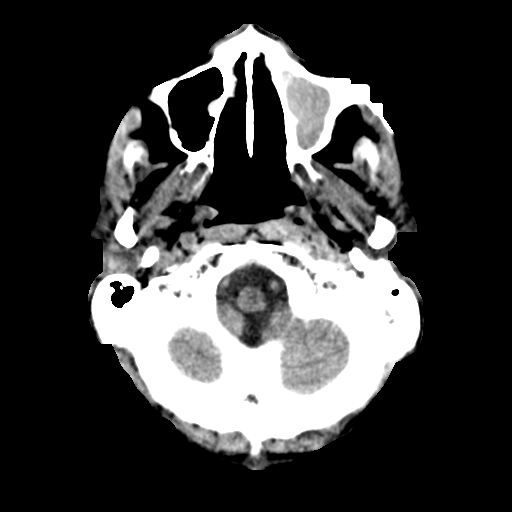
[im 3/32  bone]
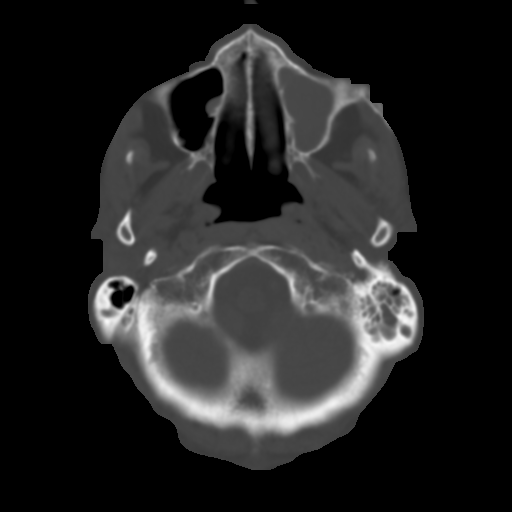
[im 6/32  brain]
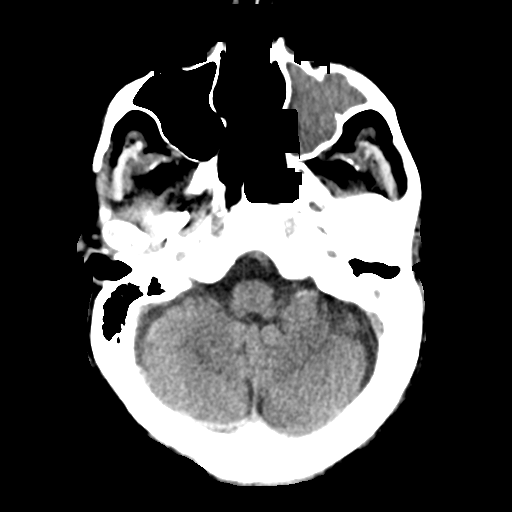
[im 9/32  brain]
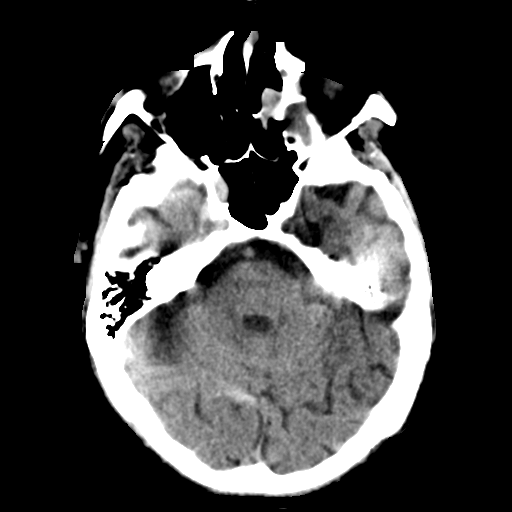
[im 12/32  brain]
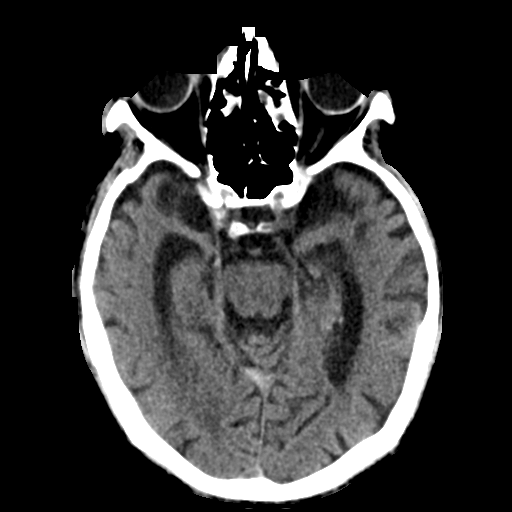
[im 17/32  brain]
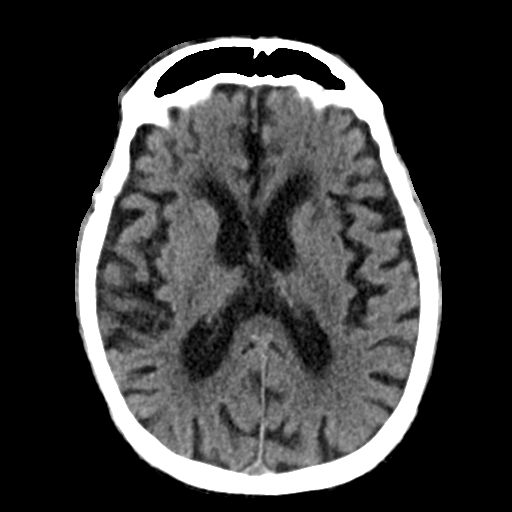
[im 17/32  bone]
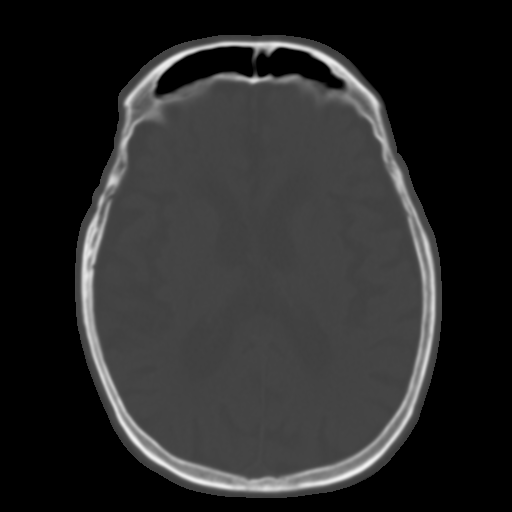
[im 20/32  brain]
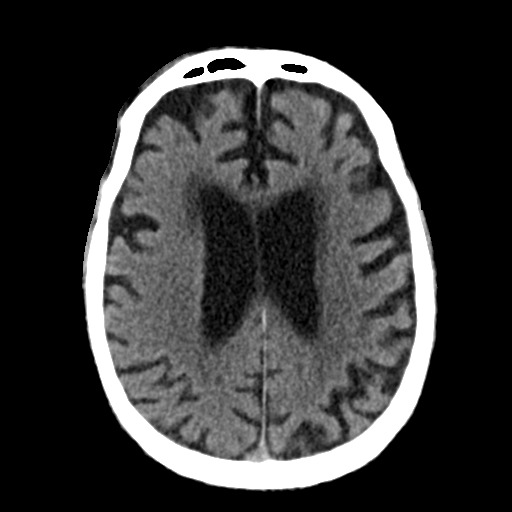
[im 23/32  brain]
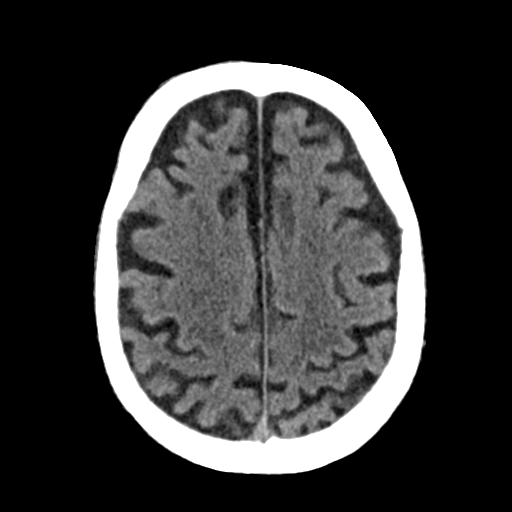
[im 26/32  brain]
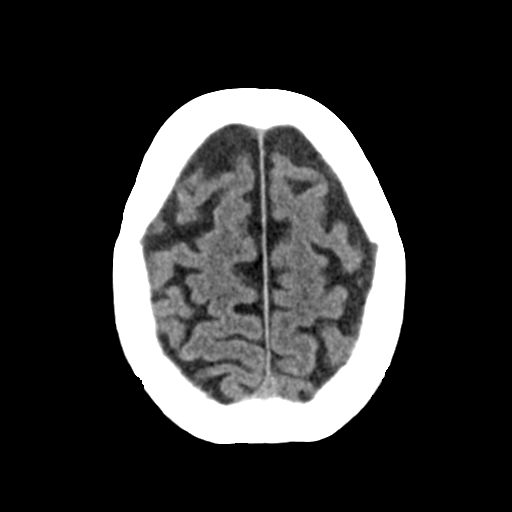
[im 29/32  brain]
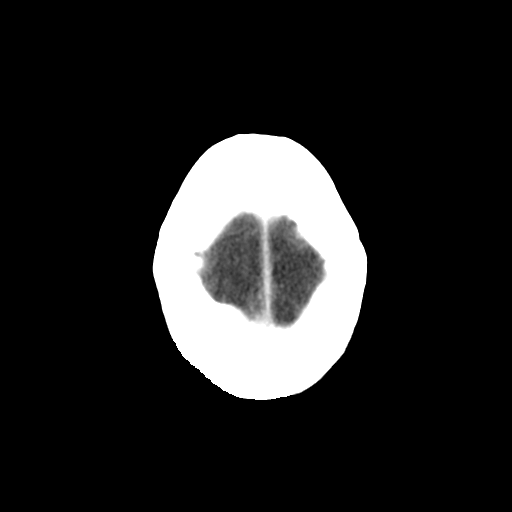
[im 29/32  bone]
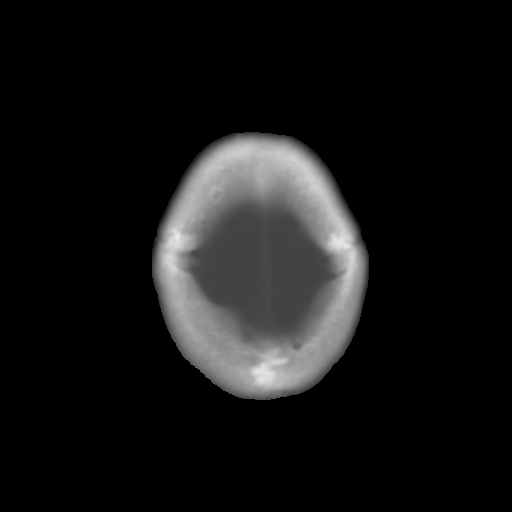

[Series 4: coronal soft tissue · coronal · 0.33mm/px · 3 of 65 slices shown]
[im 22/65  brain]
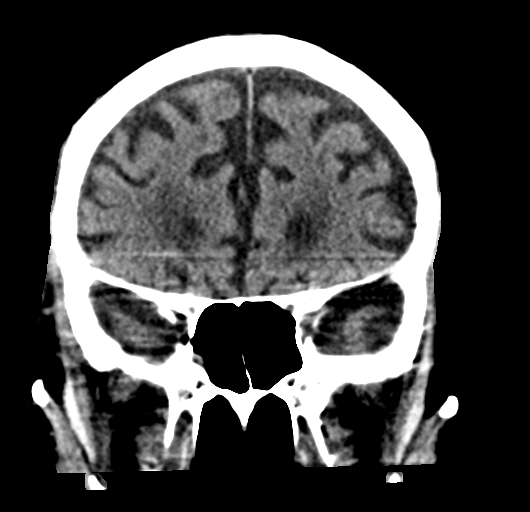
[im 29/65  brain]
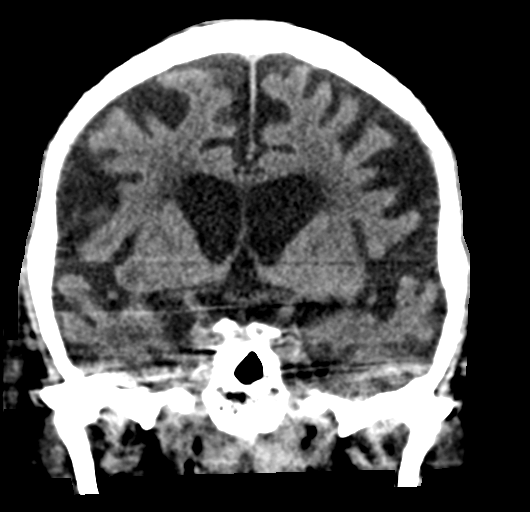
[im 36/65  brain]
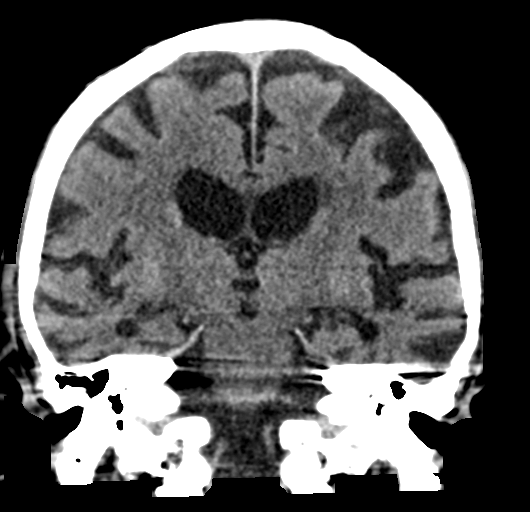

[Series 5: sagittal soft tissue · sagittal · 0.34mm/px · 3 of 53 slices shown]
[im 18/53  brain]
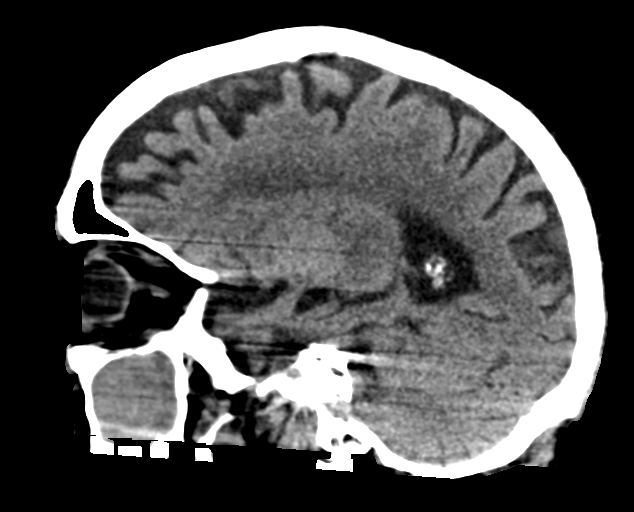
[im 27/53  brain]
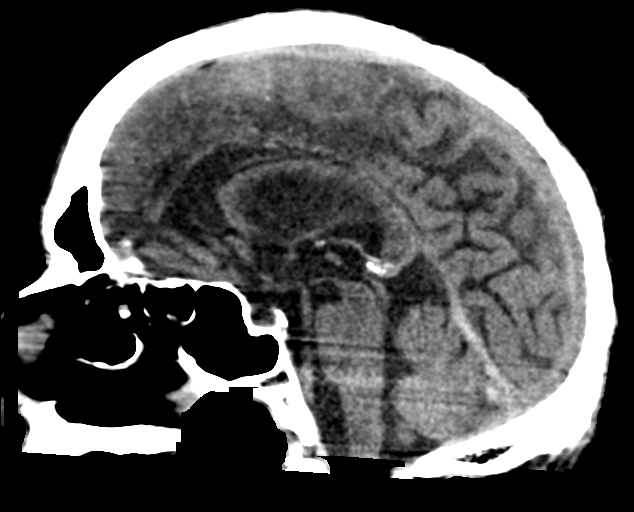
[im 35/53  brain]
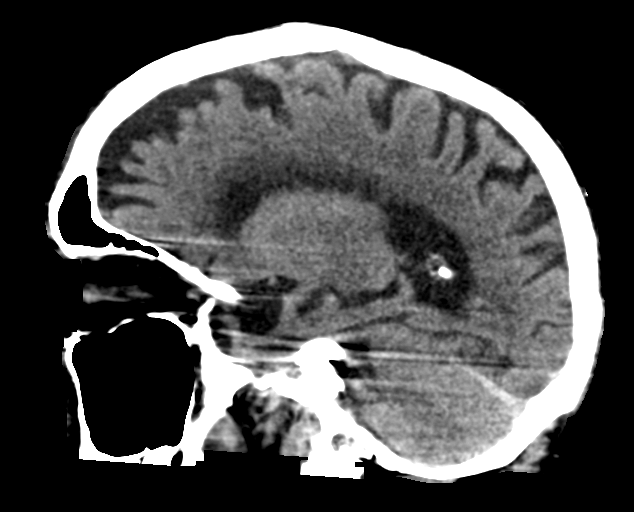

[15 of 47 positions shown; findings below may reference images not displayed]

FINDINGS: Brain: There is small volume subdural hemorrhage along the right
tentorium, with some changes in distribution but not significantly
changed in overall amount. No new intracranial hemorrhage is
identified. There is no evidence of acute infarct, mass, or midline
shift. There is moderate cerebral atrophy and mild chronic small
vessel ischemic change in the cerebral white matter.

Vascular: No hyperdense vessel or unexpected calcification.

Skull: No skull fracture or focal osseous lesion.

Sinuses/Orbits: Unchanged complete left maxillary sinus
opacification with mild osteitis suggestive of chronic sinusitis.
Unchanged left greater than right ethmoid air cell opacification,
right sphenoid hand right maxillary sinus mucosal thickening/ mucous
retention cysts, and large left mastoid effusion. Prior bilateral
cataract extraction.

Other: Right supra orbital scalp hematoma, unchanged.
IMPRESSION: 1. Persistent small volume subdural hemorrhage along the right
tentorium.
2. No new intracranial abnormality.
3. Right supraorbital scalp hematoma.

## 2018-03-31 IMAGING — DX DG CHEST 1V PORT
1 series · 1 of 1 positions shown · non-contrast
Comparison: 06/28/2015.

CLINICAL DATA: Cough.

EXAM:
PORTABLE CHEST 1 VIEW

[chest ap]
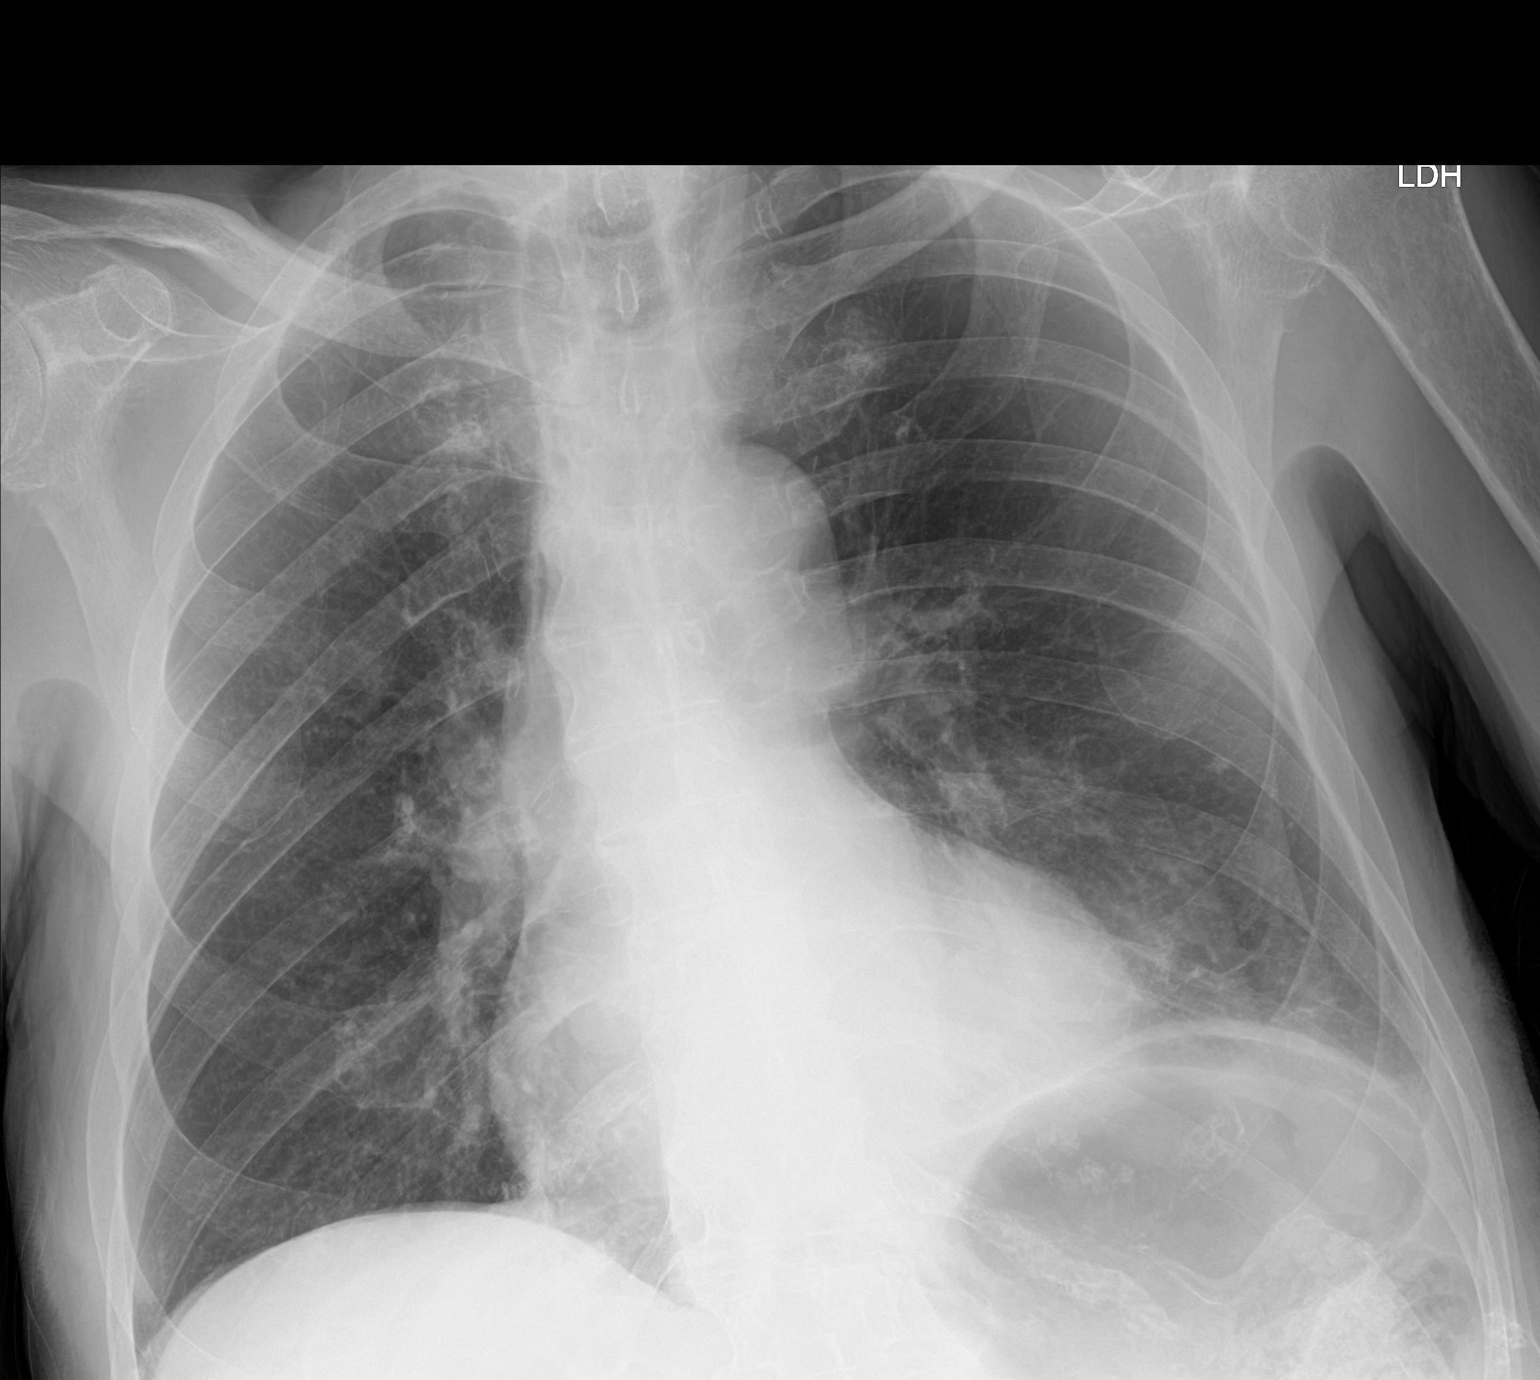

[1 of 1 positions shown; findings below may reference images not displayed]

FINDINGS: Mediastinum hilar structures normal. Left base atelectasis and
infiltrate. Small left pleural effusion. Previously identified right
base infiltrate has cleared. Cardiomegaly with normal pulmonary
vascularity.
IMPRESSION: Mild left base atelectasis and infiltrate. Findings consist with
pneumonia. Small left pleural effusion.
